# Patient Record
Sex: Female | Born: 1955 | Race: White | Hispanic: No | Marital: Married | Smoking: Never smoker
Health system: Southern US, Community
[De-identification: ages and names within clinical notes are randomized; demographics above are authoritative.]

## PROBLEM LIST (undated history)

## (undated) ENCOUNTER — Emergency Department (HOSPITAL_COMMUNITY): Admission: EM | Payer: BC Managed Care – PPO | Source: Home / Self Care

## (undated) DIAGNOSIS — F329 Major depressive disorder, single episode, unspecified: Secondary | ICD-10-CM

## (undated) DIAGNOSIS — K648 Other hemorrhoids: Secondary | ICD-10-CM

## (undated) DIAGNOSIS — G709 Myoneural disorder, unspecified: Secondary | ICD-10-CM

## (undated) DIAGNOSIS — I8390 Asymptomatic varicose veins of unspecified lower extremity: Secondary | ICD-10-CM

## (undated) DIAGNOSIS — E785 Hyperlipidemia, unspecified: Secondary | ICD-10-CM

## (undated) DIAGNOSIS — Z8742 Personal history of other diseases of the female genital tract: Secondary | ICD-10-CM

## (undated) DIAGNOSIS — N390 Urinary tract infection, site not specified: Secondary | ICD-10-CM

## (undated) DIAGNOSIS — R1032 Left lower quadrant pain: Secondary | ICD-10-CM

## (undated) DIAGNOSIS — M8580 Other specified disorders of bone density and structure, unspecified site: Secondary | ICD-10-CM

## (undated) DIAGNOSIS — K219 Gastro-esophageal reflux disease without esophagitis: Secondary | ICD-10-CM

## (undated) DIAGNOSIS — F419 Anxiety disorder, unspecified: Secondary | ICD-10-CM

## (undated) DIAGNOSIS — F32A Depression, unspecified: Secondary | ICD-10-CM

## (undated) DIAGNOSIS — M25561 Pain in right knee: Secondary | ICD-10-CM

## (undated) DIAGNOSIS — R112 Nausea with vomiting, unspecified: Secondary | ICD-10-CM

## (undated) DIAGNOSIS — J309 Allergic rhinitis, unspecified: Secondary | ICD-10-CM

## (undated) DIAGNOSIS — R21 Rash and other nonspecific skin eruption: Secondary | ICD-10-CM

## (undated) DIAGNOSIS — G629 Polyneuropathy, unspecified: Secondary | ICD-10-CM

## (undated) DIAGNOSIS — E538 Deficiency of other specified B group vitamins: Secondary | ICD-10-CM

## (undated) DIAGNOSIS — E7800 Pure hypercholesterolemia, unspecified: Secondary | ICD-10-CM

## (undated) DIAGNOSIS — T7840XA Allergy, unspecified, initial encounter: Secondary | ICD-10-CM

## (undated) DIAGNOSIS — D126 Benign neoplasm of colon, unspecified: Secondary | ICD-10-CM

## (undated) DIAGNOSIS — K644 Residual hemorrhoidal skin tags: Secondary | ICD-10-CM

## (undated) DIAGNOSIS — E559 Vitamin D deficiency, unspecified: Secondary | ICD-10-CM

## (undated) DIAGNOSIS — Z87442 Personal history of urinary calculi: Secondary | ICD-10-CM

## (undated) DIAGNOSIS — G8929 Other chronic pain: Secondary | ICD-10-CM

## (undated) DIAGNOSIS — K921 Melena: Secondary | ICD-10-CM

## (undated) DIAGNOSIS — G4700 Insomnia, unspecified: Secondary | ICD-10-CM

## (undated) DIAGNOSIS — Z9889 Other specified postprocedural states: Secondary | ICD-10-CM

## (undated) DIAGNOSIS — M25562 Pain in left knee: Secondary | ICD-10-CM

## (undated) DIAGNOSIS — I10 Essential (primary) hypertension: Secondary | ICD-10-CM

## (undated) DIAGNOSIS — Z87898 Personal history of other specified conditions: Secondary | ICD-10-CM

## (undated) DIAGNOSIS — E139 Other specified diabetes mellitus without complications: Secondary | ICD-10-CM

## (undated) HISTORY — DX: Personal history of other specified conditions: Z87.898

## (undated) HISTORY — DX: Rash and other nonspecific skin eruption: R21

## (undated) HISTORY — DX: Myoneural disorder, unspecified: G70.9

## (undated) HISTORY — DX: Other specified diabetes mellitus without complications: E13.9

## (undated) HISTORY — DX: Personal history of other diseases of the female genital tract: Z87.42

## (undated) HISTORY — DX: Insomnia, unspecified: G47.00

## (undated) HISTORY — DX: Allergy, unspecified, initial encounter: T78.40XA

## (undated) HISTORY — DX: Polyneuropathy, unspecified: G62.9

## (undated) HISTORY — DX: Asymptomatic varicose veins of unspecified lower extremity: I83.90

## (undated) HISTORY — DX: Other chronic pain: G89.29

## (undated) HISTORY — DX: Benign neoplasm of colon, unspecified: D12.6

## (undated) HISTORY — PX: UPPER GASTROINTESTINAL ENDOSCOPY: SHX188

## (undated) HISTORY — DX: Deficiency of other specified B group vitamins: E53.8

## (undated) HISTORY — DX: Hyperlipidemia, unspecified: E78.5

## (undated) HISTORY — DX: Other specified disorders of bone density and structure, unspecified site: M85.80

## (undated) HISTORY — PX: APPENDECTOMY: SHX54

## (undated) HISTORY — DX: Allergic rhinitis, unspecified: J30.9

## (undated) HISTORY — PX: ENDOMETRIAL CRYOABLATION: SHX1499

## (undated) HISTORY — DX: Vitamin D deficiency, unspecified: E55.9

## (undated) HISTORY — DX: Other hemorrhoids: K64.8

## (undated) HISTORY — PX: TONSILLECTOMY AND ADENOIDECTOMY: SUR1326

## (undated) HISTORY — DX: Depression, unspecified: F32.A

## (undated) HISTORY — DX: Pain in left knee: M25.562

## (undated) HISTORY — DX: Major depressive disorder, single episode, unspecified: F32.9

## (undated) HISTORY — DX: Anxiety disorder, unspecified: F41.9

## (undated) HISTORY — DX: Pure hypercholesterolemia, unspecified: E78.00

## (undated) HISTORY — PX: POLYPECTOMY: SHX149

## (undated) HISTORY — DX: Essential (primary) hypertension: I10

## (undated) HISTORY — DX: Urinary tract infection, site not specified: N39.0

## (undated) HISTORY — DX: Melena: K92.1

## (undated) HISTORY — PX: UPPER GI ENDOSCOPY: SHX6162

## (undated) HISTORY — DX: Pain in right knee: M25.561

## (undated) HISTORY — DX: Residual hemorrhoidal skin tags: K64.4

## (undated) HISTORY — DX: Left lower quadrant pain: R10.32

---

## 1998-02-16 ENCOUNTER — Ambulatory Visit (HOSPITAL_COMMUNITY): Admission: RE | Admit: 1998-02-16 | Discharge: 1998-02-16 | Payer: Self-pay | Admitting: Obstetrics and Gynecology

## 1998-02-19 ENCOUNTER — Ambulatory Visit (HOSPITAL_COMMUNITY): Admission: RE | Admit: 1998-02-19 | Discharge: 1998-02-19 | Payer: Self-pay | Admitting: Obstetrics and Gynecology

## 1999-04-05 ENCOUNTER — Encounter: Payer: Self-pay | Admitting: Obstetrics and Gynecology

## 1999-04-05 ENCOUNTER — Ambulatory Visit (HOSPITAL_COMMUNITY): Admission: RE | Admit: 1999-04-05 | Discharge: 1999-04-05 | Payer: Self-pay | Admitting: Obstetrics and Gynecology

## 1999-11-30 ENCOUNTER — Other Ambulatory Visit: Admission: RE | Admit: 1999-11-30 | Discharge: 1999-11-30 | Payer: Self-pay | Admitting: Obstetrics and Gynecology

## 2000-06-13 ENCOUNTER — Ambulatory Visit (HOSPITAL_COMMUNITY): Admission: RE | Admit: 2000-06-13 | Discharge: 2000-06-13 | Payer: Self-pay | Admitting: Obstetrics and Gynecology

## 2000-06-13 ENCOUNTER — Encounter: Payer: Self-pay | Admitting: Obstetrics and Gynecology

## 2001-01-02 ENCOUNTER — Other Ambulatory Visit: Admission: RE | Admit: 2001-01-02 | Discharge: 2001-01-02 | Payer: Self-pay | Admitting: Obstetrics and Gynecology

## 2001-06-18 ENCOUNTER — Ambulatory Visit (HOSPITAL_COMMUNITY): Admission: RE | Admit: 2001-06-18 | Discharge: 2001-06-18 | Payer: Self-pay | Admitting: Obstetrics and Gynecology

## 2001-06-18 ENCOUNTER — Encounter: Payer: Self-pay | Admitting: Obstetrics and Gynecology

## 2002-01-15 ENCOUNTER — Other Ambulatory Visit: Admission: RE | Admit: 2002-01-15 | Discharge: 2002-01-15 | Payer: Self-pay | Admitting: Obstetrics and Gynecology

## 2002-06-27 ENCOUNTER — Ambulatory Visit (HOSPITAL_COMMUNITY): Admission: RE | Admit: 2002-06-27 | Discharge: 2002-06-27 | Payer: Self-pay | Admitting: Obstetrics and Gynecology

## 2002-06-27 ENCOUNTER — Encounter: Payer: Self-pay | Admitting: Obstetrics and Gynecology

## 2003-08-19 ENCOUNTER — Ambulatory Visit (HOSPITAL_COMMUNITY): Admission: RE | Admit: 2003-08-19 | Discharge: 2003-08-19 | Payer: Self-pay | Admitting: Obstetrics and Gynecology

## 2004-05-26 ENCOUNTER — Ambulatory Visit (HOSPITAL_COMMUNITY): Admission: RE | Admit: 2004-05-26 | Discharge: 2004-05-26 | Payer: Self-pay | Admitting: Specialist

## 2004-07-20 ENCOUNTER — Ambulatory Visit: Payer: Self-pay | Admitting: Gastroenterology

## 2004-08-05 ENCOUNTER — Ambulatory Visit: Payer: Self-pay | Admitting: Gastroenterology

## 2004-09-17 ENCOUNTER — Ambulatory Visit (HOSPITAL_COMMUNITY): Admission: RE | Admit: 2004-09-17 | Discharge: 2004-09-17 | Payer: Self-pay | Admitting: Obstetrics and Gynecology

## 2005-11-10 ENCOUNTER — Ambulatory Visit (HOSPITAL_COMMUNITY): Admission: RE | Admit: 2005-11-10 | Discharge: 2005-11-10 | Payer: Self-pay | Admitting: Obstetrics and Gynecology

## 2006-01-31 ENCOUNTER — Emergency Department (HOSPITAL_COMMUNITY): Admission: EM | Admit: 2006-01-31 | Discharge: 2006-01-31 | Payer: Self-pay | Admitting: Emergency Medicine

## 2006-12-01 ENCOUNTER — Ambulatory Visit (HOSPITAL_COMMUNITY): Admission: RE | Admit: 2006-12-01 | Discharge: 2006-12-01 | Payer: Self-pay | Admitting: Obstetrics and Gynecology

## 2007-06-13 ENCOUNTER — Ambulatory Visit: Payer: Self-pay | Admitting: Gastroenterology

## 2007-11-02 ENCOUNTER — Ambulatory Visit: Payer: Self-pay | Admitting: Gastroenterology

## 2007-11-13 DIAGNOSIS — I10 Essential (primary) hypertension: Secondary | ICD-10-CM

## 2007-11-13 DIAGNOSIS — K644 Residual hemorrhoidal skin tags: Secondary | ICD-10-CM

## 2007-11-13 DIAGNOSIS — K921 Melena: Secondary | ICD-10-CM | POA: Insufficient documentation

## 2007-11-13 DIAGNOSIS — R1032 Left lower quadrant pain: Secondary | ICD-10-CM | POA: Insufficient documentation

## 2007-11-13 DIAGNOSIS — Z8742 Personal history of other diseases of the female genital tract: Secondary | ICD-10-CM

## 2007-11-13 DIAGNOSIS — K648 Other hemorrhoids: Secondary | ICD-10-CM

## 2007-11-13 HISTORY — DX: Residual hemorrhoidal skin tags: K64.4

## 2007-11-13 HISTORY — DX: Personal history of other diseases of the female genital tract: Z87.42

## 2007-11-13 HISTORY — DX: Melena: K92.1

## 2007-11-13 HISTORY — DX: Essential (primary) hypertension: I10

## 2007-11-13 HISTORY — DX: Other hemorrhoids: K64.8

## 2007-11-13 HISTORY — DX: Left lower quadrant pain: R10.32

## 2008-02-22 ENCOUNTER — Ambulatory Visit (HOSPITAL_COMMUNITY): Admission: RE | Admit: 2008-02-22 | Discharge: 2008-02-22 | Payer: Self-pay | Admitting: Obstetrics and Gynecology

## 2009-02-23 ENCOUNTER — Ambulatory Visit (HOSPITAL_COMMUNITY): Admission: RE | Admit: 2009-02-23 | Discharge: 2009-02-23 | Payer: Self-pay | Admitting: Obstetrics and Gynecology

## 2009-07-09 ENCOUNTER — Encounter (INDEPENDENT_AMBULATORY_CARE_PROVIDER_SITE_OTHER): Payer: Self-pay | Admitting: *Deleted

## 2009-09-05 HISTORY — PX: COLONOSCOPY: SHX174

## 2010-02-16 ENCOUNTER — Encounter (INDEPENDENT_AMBULATORY_CARE_PROVIDER_SITE_OTHER): Payer: Self-pay | Admitting: *Deleted

## 2010-02-19 ENCOUNTER — Ambulatory Visit: Payer: Self-pay | Admitting: Gastroenterology

## 2010-02-26 ENCOUNTER — Ambulatory Visit (HOSPITAL_COMMUNITY): Admission: RE | Admit: 2010-02-26 | Discharge: 2010-02-26 | Payer: Self-pay | Admitting: Obstetrics and Gynecology

## 2010-03-01 ENCOUNTER — Ambulatory Visit: Payer: Self-pay | Admitting: Gastroenterology

## 2010-03-02 ENCOUNTER — Encounter: Payer: Self-pay | Admitting: Gastroenterology

## 2010-07-28 ENCOUNTER — Observation Stay (HOSPITAL_COMMUNITY): Admission: EM | Admit: 2010-07-28 | Discharge: 2010-07-29 | Payer: Self-pay | Admitting: Emergency Medicine

## 2010-10-07 NOTE — Letter (Signed)
Summary: Patient Notice-Hyperplastic Polyps  [facility]  [address] [place], [place] [address]   Phone: [phone]  Fax: [phone]        March 02, 2010 MRN: [id number]    Amanda Anthony [address] [place], [place]  [address]    Dear [name],  I am pleased to inform you that the colon polyp(s) removed during your recent colonoscopy was (were) found to be hyperplastic. These types of polyps are NOT pre-cancerous.  It is my recommendation that you have a repeat colonoscopy examination in 5 years for routine colorectal cancer screening.  Should you develop new or worsening symptoms of abdominal pain, bowel habit changes or bleeding from the rectum or bowels, please schedule an evaluation with either your primary care physician or with me.  Continue treatment plan as outlined the day of your exam.  Please call us if you are having persistent problems or have questions about your condition that have not been fully answered at this time.  Sincerely,  Meryl Dare MD [facility]  This letter has been electronically signed by your physician.  Appended Document: Patient Notice-Hyperplastic Polyps letter mailed 7.6.11

## 2010-10-07 NOTE — Miscellaneous (Signed)
Summary: [facility] Previsit/prep  Clinical Lists Changes  Medications: Added new medication of MOVIPREP 100 GM  SOLR (PEG-KCL-NACL-NASULF-NA ASC-C) As per prep instructions. - Signed Rx of MOVIPREP 100 GM  SOLR (PEG-KCL-NACL-NASULF-NA ASC-C) As per prep instructions.;  #1 x 0;  Signed;  Entered by: Wyona Almas RN;  Authorized by: Meryl Dare MD Clementeen Graham;  Method used: Electronically to [facility] [address], [place], [place], [place]  [address], Ph: [phone] or [phone], Fax: [phone] Observations: Added new observation of NKA: T (02/19/2010 10:58)    Prescriptions: MOVIPREP 100 GM  SOLR (PEG-KCL-NACL-NASULF-NA ASC-C) As per prep instructions.  #1 x 0   Entered by:   Wyona Almas RN   Authorized by:   Meryl Dare MD [facility]   Signed by:   Wyona Almas RN on 02/19/2010   Method used:   Electronically to        [facility] (retail)       [address]       [place], [place]  [address]       Ph: [phone] or [phone]       Fax: [phone]   RxID:   [id number]

## 2010-10-07 NOTE — Letter (Signed)
Summary: [facility] Instructions  [facility]  [address] [place], [place] [address]   Phone: [phone]  Fax: [phone]       Amanda Anthony    1955-10-13    MRN: [id number]        Procedure Day Dorna Bloom:  Duanne Limerick  03/01/10     Arrival Time: 10:30am     Procedure Time:  11:30am     Location of Procedure:                    _X _  [facility] (4th Floor)                       PREPARATION FOR COLONOSCOPY WITH MOVIPREP   Starting 5 days prior to your procedure  [facility] 02/24/10  do not eat nuts, seeds, popcorn, corn, beans, peas,  salads, or any raw vegetables.  Do not take any fiber supplements (e.g. Metamucil, Citrucel, and Benefiber).  THE DAY BEFORE YOUR PROCEDURE         DATE: SUNDAY  02/28/10  1.  Drink clear liquids the entire day-NO SOLID FOOD  2.  Do not drink anything colored red or purple.  Avoid juices with pulp.  No orange juice.  3.  Drink at least 64 oz. (8 glasses) of fluid/clear liquids during the day to prevent dehydration and help the prep work efficiently.  CLEAR LIQUIDS INCLUDE: Water Jello Ice Popsicles Tea (sugar ok, no milk/cream) Powdered fruit flavored drinks Coffee (sugar ok, no milk/cream) Gatorade Juice: apple, white grape, white cranberry  Lemonade Clear bullion, consomm, broth Carbonated beverages (any kind) Strained chicken noodle soup Hard Candy                             4.  In the morning, mix first dose of MoviPrep solution:    Empty 1 Pouch A and 1 Pouch B into the disposable container    Add lukewarm drinking water to the top line of the container. Mix to dissolve    Refrigerate (mixed solution should be used within 24 hrs)  5.  Begin drinking the prep at 5:00 p.m. The MoviPrep container is divided by 4 marks.   Every 15 minutes drink the solution down to the next mark (approximately 8 oz) until the full liter is complete.   6.  Follow completed prep with 16 oz of clear liquid of your choice  (Nothing red or purple).  Continue to drink clear liquids until bedtime.  7.  Before going to bed, mix second dose of MoviPrep solution:    Empty 1 Pouch A and 1 Pouch B into the disposable container    Add lukewarm drinking water to the top line of the container. Mix to dissolve    Refrigerate  THE DAY OF YOUR PROCEDURE      DATE:  [facility]  03/01/10  Beginning at  6:30 a.m. (5 hours before procedure):         1. Every 15 minutes, drink the solution down to the next mark (approx 8 oz) until the full liter is complete.  2. Follow completed prep with 16 oz. of clear liquid of your choice.    3. You may drink clear liquids until  9:30am  (2 HOURS BEFORE PROCEDURE).   MEDICATION INSTRUCTIONS  Unless otherwise instructed, you should take regular prescription medications with a small sip of water   as early  as possible the morning of your procedure.        OTHER INSTRUCTIONS  You will need a responsible adult at least 55 years of age to accompany you and drive you home.   This person must remain in the waiting room during your procedure.  Wear loose fitting clothing that is easily removed.  Leave jewelry and other valuables at home.  However, you may wish to bring a book to read or  an iPod/MP3 player to listen to music as you wait for your procedure to start.  Remove all body piercing jewelry and leave at home.  Total time from sign-in until discharge is approximately 2-3 hours.  You should go home directly after your procedure and rest.  You can resume normal activities the  day after your procedure.  The day of your procedure you should not:   Drive   Make legal decisions   Operate machinery   Drink alcohol   Return to work  You will receive specific instructions about eating, activities and medications before you leave.    The above instructions have been reviewed and explained to me by   Wyona Almas RN  February 19, 2010 11:28 AM     I fully understand  and can verbalize these instructions _____________________________ Date _________

## 2010-10-07 NOTE — Procedures (Signed)
Summary: Colonoscopy  Patient: Amanda Anthony Note: All result statuses are Final unless otherwise noted.  Tests: (1) Colonoscopy (COL)   COL Colonoscopy           DONE     Amanda Anthony     [address]     [place], [place]  [address]           COLONOSCOPY PROCEDURE REPORT           PATIENT:  Amanda, Anthony  MR#:  [id number]     BIRTHDATE:  08-12-1956, 53 yrs. old  GENDER:  female     ENDOSCOPIST:  Judie Petit T. Russella Dar, MD, [facility]           PROCEDURE DATE:  03/01/2010     PROCEDURE:  Colonoscopy with biopsy     ASA CLASS:  Class I     INDICATIONS:  1) Elevated Risk Screening:  family history of colon     cancer-MGM and M aunt, family Hx of polyps-both parents     MEDICATIONS:   Fentanyl 75 mcg IV, Versed 7 mg IV     DESCRIPTION OF PROCEDURE:   After the risks benefits and     alternatives of the procedure were thoroughly explained, informed     consent was obtained.  Digital rectal exam was performed and     revealed no abnormalities.   The LB PCF-H180AL [id number] endoscope     was introduced through the anus and advanced to the cecum, which     was identified by both the appendix and ileocecal valve, without     limitations.  The quality of the prep was excellent, using     MoviPrep.  The instrument was then slowly withdrawn as the colon     was fully examined.     <<PROCEDUREIMAGES>>     FINDINGS:  Two polyps were found in the proximal transverse colon.     They were 3 - 4 mm in size. The polyps were removed using cold     biopsy forceps.  A sessile polyp was found in the sigmoid colon.     It was 4 mm in size. The polyp was removed using cold biopsy     forceps.  A normal appearing cecum, ileocecal valve, and     appendiceal orifice were identified. The ascending, hepatic     flexure, splenic flexure, descending colon, and rectum appeared     unremarkable. Retroflexed views in the rectum revealed no     abnormalities. The time to cecum =  1.75  minutes. The scope was     then  withdrawn (time =  8.75  min) from the patient and the     procedure completed.           COMPLICATIONS:  None           ENDOSCOPIC IMPRESSION:     1) 3 - 4 mm, two polyps in the proximal transverse colon     2) 4 mm sessile polyp in the sigmoid colon           RECOMMENDATIONS:     1) Await pathology results     2) Repeat Colonoscopy in 5 years pending pathology review           Amanda Anthony T. Russella Dar, MD, Amanda Anthony           CC: Amanda Felix MD           n.     Amanda Anthony:  Amanda Anthony at 03/01/2010 11:57 AM           Amanda Anthony, [id number]  Note: An exclamation mark (!) indicates a result that was not dispersed into the flowsheet. Document Creation Date: 03/01/2010 11:58 AM _______________________________________________________________________  (1) Order result status: Final Collection or observation date-time: 03/01/2010 11:49 Requested date-time:  Receipt date-time:  Reported date-time:  Referring Physician:   Ordering Physician: Amanda Anthony [phone]) Specimen Source:  Source: Amanda Anthony Order Number: [id number] Lab site:   Appended Document: Colonoscopy     Procedures Next Due Date:    Colonoscopy: 02/2015

## 2010-11-16 LAB — PROTIME-INR: INR: 1.33 (ref 0.00–1.49)

## 2010-11-16 LAB — CBC
HCT: 38.5 % (ref 36.0–46.0)
Hemoglobin: 12.5 g/dL (ref 12.0–15.0)
MCH: 26.9 pg (ref 26.0–34.0)
MCH: 27.3 pg (ref 26.0–34.0)
MCH: 27.6 pg (ref 26.0–34.0)
MCHC: 32.2 g/dL (ref 30.0–36.0)
MCHC: 32.6 g/dL (ref 30.0–36.0)
Platelets: 250 10*3/uL (ref 150–400)
RBC: 4.53 MIL/uL (ref 3.87–5.11)
RBC: 4.55 MIL/uL (ref 3.87–5.11)
RDW: 14.5 % (ref 11.5–15.5)
RDW: 14.5 % (ref 11.5–15.5)
WBC: 7.9 10*3/uL (ref 4.0–10.5)
WBC: 9.2 10*3/uL (ref 4.0–10.5)

## 2010-11-16 LAB — POCT CARDIAC MARKERS
CKMB, poc: <1 ng/mL — ABNORMAL LOW (ref 1.0–8.0)
Troponin i, poc: <0.05 ng/mL (ref 0.00–0.09)

## 2010-11-16 LAB — COMPREHENSIVE METABOLIC PANEL
AST: 19 U/L (ref 0–37)
Alkaline Phosphatase: 62 U/L (ref 39–117)
BUN: 13 mg/dL (ref 6–23)
Calcium: 8.9 mg/dL (ref 8.4–10.5)
Creatinine, Ser: 0.78 mg/dL (ref 0.4–1.2)
GFR calc non Af Amer: >60 mL/min (ref >60)
Potassium: 3.9 mEq/L (ref 3.5–5.1)
Sodium: 137 mEq/L (ref 135–145)
Total Bilirubin: 0.4 mg/dL (ref 0.3–1.2)

## 2010-11-16 LAB — DIFFERENTIAL
Basophils Relative: 0 % (ref 0–1)
Eosinophils Absolute: 0.1 10*3/uL (ref 0.0–0.7)
Eosinophils Absolute: 0.1 10*3/uL (ref 0.0–0.7)
Eosinophils Relative: 1 % (ref 0–5)
Lymphocytes Relative: 29 % (ref 12–46)
Monocytes Absolute: 0.6 10*3/uL (ref 0.1–1.0)
Monocytes Absolute: 0.6 10*3/uL (ref 0.1–1.0)
Monocytes Relative: 6 % (ref 3–12)
Monocytes Relative: 7 % (ref 3–12)
Neutro Abs: 5.4 10*3/uL (ref 1.7–7.7)
Neutrophils Relative %: 63 % (ref 43–77)

## 2010-11-16 LAB — URINALYSIS, ROUTINE W REFLEX MICROSCOPIC
Bilirubin Urine: NEGATIVE
Glucose, UA: NEGATIVE mg/dL
Hgb urine dipstick: NEGATIVE
Urobilinogen, UA: 0.2 mg/dL (ref 0.0–1.0)
pH: 5 (ref 5.0–8.0)

## 2010-11-16 LAB — TSH: TSH: 0.531 u[IU]/mL (ref 0.350–4.500)

## 2010-11-16 LAB — CARDIAC PANEL(CRET KIN+CKTOT+MB+TROPI)
CK, MB: 1 ng/mL (ref 0.3–4.0)
CK, MB: 1.2 ng/mL (ref 0.3–4.0)
Total CK: 61 U/L (ref 7–177)
Troponin I: 0.01 ng/mL (ref 0.00–0.06)

## 2010-11-16 LAB — GLUCOSE, CAPILLARY: Glucose-Capillary: 108 mg/dL — ABNORMAL HIGH (ref 70–99)

## 2010-11-16 LAB — BASIC METABOLIC PANEL
Calcium: 9.2 mg/dL (ref 8.4–10.5)
GFR calc non Af Amer: >60 mL/min (ref >60)
Potassium: 4 mEq/L (ref 3.5–5.1)
Sodium: 137 mEq/L (ref 135–145)

## 2010-11-16 LAB — LIPID PANEL: Cholesterol: 200 mg/dL (ref 0–200)

## 2011-01-18 NOTE — Assessment & Plan Note (Signed)
[facility]                         GASTROENTEROLOGY OFFICE NOTE   NAME:Amanda Anthony, Amanda Anthony                        MRN:          [id number]  DATE:11/02/2007                            DOB:          22-Feb-1956    Amanda Anthony returns complaining of a small bump on the outside of her  rectum.  She has a known history of hemorrhoids.  She has no other  colorectal complaints and notes no rectal bleeding.  Change in stool  caliber, change in bowel habits, rectal pain, rectal itching or  abdominal pain.  Her maternal grandmother and a maternal aunt both had  colon cancer.  The patient's prior colonoscopy August 05, 2004 showed  internal hemorrhoids and no other abnormalities.   CURRENT MEDICATIONS:  Calcium daily  Ambien p.r.n.   MEDICATION ALLERGIES:  NONE KNOWN.   PHYSICAL EXAMINATION:  In no acute distress.  Weight 204.4.  Blood  pressure 128/72.  Pulse 82 and regular.  CHEST:  Clear to auscultation bilaterally.  CARDIAC:  Regular rate and rhythm without murmurs.  ABDOMEN:  Soft, nontender with normoactive bowel sounds.  No palpable  organomegaly, masses, or hernias.  RECTAL:  Reveals external hemorrhoids and hemorrhoidal tags.  No other  lesions noted.  Digital examination reveals no lesions, and/or  tenderness.  There is hemoccult negative, yellow-brown mucus in the  rectal vault.   ASSESSMENT AND PLAN:  1. External hemorrhoids and ext. hemorrhoid tags.  She is reassured.      She may try an over-the-counter hydrocortisone hemorrhoidal cream      as needed.  She is given standard instructions on hemorrhoidal      care.  2. Family history of colon cancer.  Maternal grandmother and a      maternal aunt.  Obtain home stool hemoccults.  Recall colonoscopy      recommended for December 2010.     Venita Lick. Russella Dar, MD, [facility]  Electronically Signed    MTS/MedQ  DD: 11/02/2007  DT: 11/03/2007  Job #: [id number]

## 2011-01-18 NOTE — Assessment & Plan Note (Signed)
[facility]                         GASTROENTEROLOGY OFFICE NOTE   NAME:Scullin, Manfred Arch                        MRN:          [id number]  DATE:06/13/2007                            DOB:          14-Jun-1956    HISTORY OF PRESENT ILLNESS:  I previously evaluated Ms. Wettstein in  November 2005.  She had hematochezia and a change in bowel habits with  looser, frequent stools and mild lower abdominal cramping.  In addition,  she has a family history of colon cancer in her maternal grandmother and  maternal aunt, both at about age 78.  In addition, her father has a  history of colon polyps.  She previously underwent colonoscopy in  December 2005, which showed only internal hemorrhoids.  For the past  three years, she has had intermittently left lower quadrant pain and  over the past three weeks these symptoms have worsened and have been  associated with occasional left upper quadrant pain.  She was evaluated  by Dr. Rosalio Macadamia in June 2008, without obvious gynecologic problems  noted.  She still notes occasional small volume hematochezia.  Her  appetite is good and her weight is stable.  Her left sided abdominal  pains did not appear to be related to any digestive function and  occasionally worsened with movement.  At other times, they are  unpredictable.   CURRENT MEDICATIONS:  1. Calcium daily.  2. Ambien p.r.n.   ALLERGIES:  None known.   PHYSICAL EXAMINATION:  GENERAL:  No acute distress.  VITAL SIGNS:  Weight 206.6 pounds, blood pressure 130/80, pulse 78 and  regular.  CHEST:  Clear to auscultation bilaterally.  CARDIAC:  Regular rate and rhythm without murmurs.  ABDOMEN:  Soft with mild left lower quadrant and left upper quadrant  tenderness to deep palpation.  No rebound or guarding.  No palpable  organomegaly, masses or hernias.  Normoactive bowel sounds.  NEUROLOGICAL:  Alert and oriented x3.  Grossly nonfocal.   ASSESSMENT/PLAN:  Episodic left  lower quadrant pain for the past three  years.  Recent mild left upper quadrant pain.  She tends to have loose  stools and has noted occasional hematochezia, but her abdominal pain is  not clearly associated with any digestive function.  It is difficult to  determine whether her pain is musculoskeletal, gastrointestinal or  gynecologic.  Will obtain stool hemoccults and begin a course of Robinul  Forte 1/2 to 1 tablet b.i.d. for possible irritable bowel syndrome.  If  her symptoms do not improve over the course of 2-3 weeks, she then can  try Advil 600 mg t.i.d. and assess the response to Advil.  Return office  visit in 4-6  weeks with me.  If her symptoms have not substantially improved, will  plan to consider a CT scan of the abdomen and pelvis and colonoscopy.     Venita Lick. Russella Dar, MD, [facility]  Electronically Signed    MTS/MedQ  DD: 06/25/2007  DT: 06/26/2007  Job #: [id number]   cc:   Sherry A. Rosalio Macadamia, M.D.

## 2011-01-26 ENCOUNTER — Other Ambulatory Visit (HOSPITAL_COMMUNITY): Payer: Self-pay | Admitting: Obstetrics and Gynecology

## 2011-01-26 DIAGNOSIS — Z1231 Encounter for screening mammogram for malignant neoplasm of breast: Secondary | ICD-10-CM

## 2011-03-01 ENCOUNTER — Ambulatory Visit (HOSPITAL_COMMUNITY): Payer: Managed Care, Other (non HMO)

## 2011-03-17 ENCOUNTER — Ambulatory Visit (HOSPITAL_COMMUNITY): Payer: Managed Care, Other (non HMO)

## 2011-03-29 ENCOUNTER — Ambulatory Visit (HOSPITAL_COMMUNITY)
Admission: RE | Admit: 2011-03-29 | Discharge: 2011-03-29 | Disposition: A | Discharge status: Home / Self Care | Payer: Managed Care, Other (non HMO) | Source: Ambulatory Visit | Attending: Obstetrics and Gynecology | Admitting: Obstetrics and Gynecology

## 2011-03-29 DIAGNOSIS — Z1231 Encounter for screening mammogram for malignant neoplasm of breast: Secondary | ICD-10-CM | POA: Insufficient documentation

## 2011-12-17 ENCOUNTER — Encounter (HOSPITAL_COMMUNITY): Payer: Self-pay

## 2011-12-17 ENCOUNTER — Emergency Department (HOSPITAL_COMMUNITY)
Admission: EM | Admit: 2011-12-17 | Discharge: 2011-12-17 | Disposition: A | Discharge status: Home / Self Care | Payer: Managed Care, Other (non HMO) | Attending: Emergency Medicine | Admitting: Emergency Medicine

## 2011-12-17 ENCOUNTER — Emergency Department (HOSPITAL_COMMUNITY): Payer: Managed Care, Other (non HMO)

## 2011-12-17 DIAGNOSIS — R209 Unspecified disturbances of skin sensation: Secondary | ICD-10-CM | POA: Insufficient documentation

## 2011-12-17 DIAGNOSIS — R200 Anesthesia of skin: Secondary | ICD-10-CM

## 2011-12-17 DIAGNOSIS — E119 Type 2 diabetes mellitus without complications: Secondary | ICD-10-CM | POA: Insufficient documentation

## 2011-12-17 DIAGNOSIS — R29810 Facial weakness: Secondary | ICD-10-CM | POA: Insufficient documentation

## 2011-12-17 DIAGNOSIS — G510 Bell's palsy: Secondary | ICD-10-CM

## 2011-12-17 DIAGNOSIS — Z79899 Other long term (current) drug therapy: Secondary | ICD-10-CM | POA: Insufficient documentation

## 2011-12-17 HISTORY — DX: Gastro-esophageal reflux disease without esophagitis: K21.9

## 2011-12-17 MED ORDER — PREDNISONE 20 MG PO TABS: 60.0000 mg | ORAL_TABLET | Freq: Every day | ORAL | Status: AC

## 2011-12-17 MED ORDER — ARTIFICIAL TEARS OP OINT: TOPICAL_OINTMENT | OPHTHALMIC | Status: DC | PRN

## 2011-12-17 NOTE — ED Notes (Signed)
Last night pt. Was having pain in both of her eyes and dryness.  Shw woke up this am lt . Eye not being able to close the lid completely and her rt. Eye being swollen,  She also states that her lips are numb and she was unable to whistle,.  She is also having  Pain in her lt. Ear with pain under the ear.   No weakness in her extremities, denies any dizziness, speech is clear.  Denies any headaches or blurred vision

## 2011-12-17 NOTE — ED Provider Notes (Signed)
History     CSN: [id number]  Arrival date & time 12/17/11  1338   First MD Initiated Contact with Patient 12/17/11 1449      Chief Complaint  Patient presents with  . Numbness    (Consider location/radiation/quality/duration/timing/severity/associated sxs/prior treatment) The history is provided by the patient.   patient states that she woke up this morning with weakness in her left face and numbness in right face. She states that last night she had some pain in her right eye. She states she's been unable with. She has some pain under her left ear. No headaches or blurred vision. She's not had this before. No recent viral illness.  Past Medical History  Diagnosis Date  . Diabetes mellitus   . GERD (gastroesophageal reflux disease)     Past Surgical History  Procedure Date  . Tonsillectomy   . Appendectomy     History reviewed. No pertinent family history.  History  Substance Use Topics  . Smoking status: Never Smoker   . Smokeless tobacco: Not on file  . Alcohol Use: No    OB History    Grav Para Term Preterm Abortions TAB SAB Ect Mult Living                  Review of Systems  Constitutional: Negative for activity change and appetite change.  HENT: Negative for neck stiffness.   Eyes: Negative for pain.  Respiratory: Negative for chest tightness and shortness of breath.   Cardiovascular: Negative for chest pain and leg swelling.  Gastrointestinal: Negative for nausea, vomiting, abdominal pain and diarrhea.  Genitourinary: Negative for flank pain.  Musculoskeletal: Negative for back pain.  Skin: Negative for rash.  Neurological: Positive for weakness and numbness. Negative for headaches.  Psychiatric/Behavioral: Negative for behavioral problems.    Allergies  Review of patient's allergies indicates no known allergies.  Home Medications   Current Outpatient Rx  Name Route Sig Dispense Refill  . ESCITALOPRAM OXALATE 20 MG PO TABS Oral Take 20 mg by  mouth daily.    Marland Kitchen ESOMEPRAZOLE MAGNESIUM 40 MG PO CPDR Oral Take 40 mg by mouth daily before breakfast.    . ESZOPICLONE 2 MG PO TABS Oral Take 3 mg by mouth at bedtime. Take immediately before bedtime    . MELATONIN 1 MG PO TABS Oral Take 1 tablet by mouth at bedtime.    Marland Kitchen METFORMIN HCL 500 MG PO TABS Oral Take 500 mg by mouth every evening.    Marland Kitchen PANTOPRAZOLE SODIUM 40 MG PO TBEC Oral Take 40 mg by mouth daily.    Marland Kitchen SIMVASTATIN 20 MG PO TABS Oral Take 20 mg by mouth every evening.    Marland Kitchen ARTIFICIAL TEARS OP OINT Ophthalmic Apply to eye every 4 (four) hours as needed. 1 Tube 0  . PREDNISONE 20 MG PO TABS Oral Take 3 tablets (60 mg total) by mouth daily. 15 tablet 0    BP 141/78  Pulse 61  Temp(Src) 98.1 F (36.7 C) (Oral)  Resp 20  SpO2 98%  Physical Exam  Nursing note and vitals reviewed. Constitutional: She is oriented to person, place, and time. She appears well-developed and well-nourished.  HENT:  Head: Normocephalic and atraumatic.  Left Ear: External ear normal.  Eyes: EOM are normal. Pupils are equal, round, and reactive to light.  Neck: Normal range of motion. Neck supple.  Cardiovascular: Normal rate, regular rhythm and normal heart sounds.   No murmur heard. Pulmonary/Chest: Effort normal and breath sounds normal.  No respiratory distress. She has no wheezes. She has no rales.  Abdominal: Soft. Bowel sounds are normal. She exhibits no distension. There is no tenderness. There is no rebound and no guarding.  Musculoskeletal: Normal range of motion.  Neurological: She is alert and oriented to person, place, and time. Coordination normal.       Left-sided facial droop with forehead involvement. Subjectively decreased sensation to right face in all 3 distributions. Cranial nerves otherwise intact.  Skin: Skin is warm and dry.  Psychiatric: She has a normal mood and affect. Her speech is normal.    ED Course  Procedures (including critical care time)  Labs Reviewed - No  data to display Ct Head Wo Contrast  12/17/2011  *RADIOLOGY REPORT*  Clinical Data: Bilateral facial numbness.  CT HEAD WITHOUT CONTRAST  Technique:  Contiguous axial images were obtained from the base of the skull through the vertex without contrast.  Comparison: None.  Findings: Normal appearance of the intracranial structures.  No evidence for acute hemorrhage, mass lesion, midline shift, hydrocephalus or large infarct.  No acute bony abnormalities.  IMPRESSION: No acute intracranial abnormality.  Original Report Authenticated By: Richarda Overlie, M.D.     1. Bell's palsy   2. Rt facial numbness       MDM  Left-sided facial droop. Right-sided facial numbness. Likely Bell's palsy, complicated by the contralateral facial numbness. CT was done and was normal. Patient was not willing to get an MRI done. She said her daughter had 02 nausea. She'll be discharged to follow with her primary care Dr.        Juliet Rude. Rubin Payor, MD 12/17/11 1944

## 2011-12-17 NOTE — ED Provider Notes (Deleted)
  Physical Exam  BP 132/71  Pulse 69  Temp(Src) 98.1 F (36.7 C) (Oral)  Resp 20  SpO2 97%  Physical Exam  ED Course  Procedures  MDM   patient with left-sided facial droop. Right-sided facial numbness. She states it started when she woke up this morning. No headaches. She states she's not had pain like this before. With the combination of left-sided facial drooping contralateral numbness. CT was ordered. Patient removed in exam room     Juliet Rude. Rubin Payor, MD 12/17/11 1456

## 2011-12-17 NOTE — ED Notes (Signed)
Pt.has lt. Facial droop when she smiles.

## 2011-12-17 NOTE — Discharge Instructions (Signed)
Follow with her doctor in 2 days.  Bell's Palsy Bell's palsy is a condition in which the muscles on one side of the face cannot move (paralysis). This is because the nerves in the face are paralyzed. It is most often thought to be caused by a virus. The virus causes swelling of the nerve that controls movement on one side of the face. The nerve travels through a tight space surrounded by bone. When the nerve swells, it can be compressed by the bone. This results in damage to the protective covering around the nerve. This damage interferes with how the nerve communicates with the muscles of the face. As a result, it can cause weakness or paralysis of the facial muscles.  Injury (trauma), tumor, and surgery may cause Bell's palsy, but most of the time the cause is unknown. It is a relatively common condition. It starts suddenly (abrupt onset) with the paralysis usually ending within 2 days. Bell's palsy is not dangerous. But because the eye does not close properly, you may need care to keep the eye from getting dry. This can include splinting (to keep the eye shut) or moistening with artificial tears. Bell's palsy very seldom occurs on both sides of the face at the same time. SYMPTOMS   Eyebrow sagging.   Drooping of the eyelid and corner of the mouth.   Inability to close one eye.   Loss of taste on the front of the tongue.   Sensitivity to loud noises.  TREATMENT  The treatment is usually non-surgical. If the patient is seen within the first 24 to 48 hours, a short course of steroids may be prescribed, in an attempt to shorten the length of the condition. Antiviral medicines may also be used with the steroids, but it is unclear if they are helpful.  You will need to protect your eye, if you cannot close it. The cornea (clear covering over your eye) will become dry and can be damaged. Artificial tears can be used to keep your eye moist. Glasses or an eye patch should be worn to protect your  eye. PROGNOSIS  Recovery is variable, ranging from days to months. Although the problem usually goes away completely (about 80% of cases resolve), predicting the outcome is impossible. Most people improve within 3 weeks of when the symptoms began. Improvement may continue for 3 to 6 months. A small number of people have moderate to severe weakness that is permanent.  HOME CARE INSTRUCTIONS   If your caregiver prescribed medication to reduce swelling in the nerve, use as directed. Do not stop taking the medication unless directed by your caregiver.   Use moisturizing eye drops as needed to prevent drying of your eye, as directed by your caregiver.   Protect your eye, as directed by your caregiver.   Use facial massage and exercises, as directed by your caregiver.   Perform your normal activities, and get your normal rest.  SEEK IMMEDIATE MEDICAL CARE IF:   There is pain, redness or irritation in the eye.   You or your child has an oral temperature above 102 F (38.9 C), not controlled by medicine.  MAKE SURE YOU:   Understand these instructions.   Will watch your condition.   Will get help right away if you are not doing well or get worse.  Document Released: 08/22/2005 Document Revised: 08/11/2011 Document Reviewed: 08/31/2009 [facility] Patient Information 2012 [place], [place].

## 2011-12-17 NOTE — ED Notes (Signed)
CBG 82 tested by glucometer in the ED

## 2011-12-17 NOTE — ED Notes (Signed)
Discharge instructions reviewed with pt; verbalizes understanding.  No questions asked; no further c/o's voiced.  Pt ambulatory to lobby.  NAD noted. 

## 2011-12-22 LAB — GLUCOSE, CAPILLARY: Glucose-Capillary: 82 mg/dL (ref 70–99)

## 2012-12-10 ENCOUNTER — Other Ambulatory Visit (HOSPITAL_COMMUNITY): Payer: Self-pay | Admitting: Obstetrics

## 2012-12-10 DIAGNOSIS — Z1231 Encounter for screening mammogram for malignant neoplasm of breast: Secondary | ICD-10-CM

## 2012-12-11 ENCOUNTER — Ambulatory Visit (HOSPITAL_COMMUNITY)
Admission: RE | Admit: 2012-12-11 | Discharge: 2012-12-11 | Disposition: A | Discharge status: Home / Self Care | Payer: Managed Care, Other (non HMO) | Source: Ambulatory Visit | Attending: Obstetrics | Admitting: Obstetrics

## 2012-12-11 DIAGNOSIS — Z1231 Encounter for screening mammogram for malignant neoplasm of breast: Secondary | ICD-10-CM | POA: Insufficient documentation

## 2012-12-20 ENCOUNTER — Ambulatory Visit (HOSPITAL_COMMUNITY): Payer: Managed Care, Other (non HMO)

## 2014-02-14 ENCOUNTER — Other Ambulatory Visit (HOSPITAL_COMMUNITY): Payer: Self-pay | Admitting: Obstetrics

## 2014-02-14 DIAGNOSIS — Z1231 Encounter for screening mammogram for malignant neoplasm of breast: Secondary | ICD-10-CM

## 2014-02-26 ENCOUNTER — Ambulatory Visit (HOSPITAL_COMMUNITY)
Admission: RE | Admit: 2014-02-26 | Discharge: 2014-02-26 | Disposition: A | Discharge status: Home / Self Care | Payer: BC Managed Care – PPO | Source: Ambulatory Visit | Attending: Obstetrics | Admitting: Obstetrics

## 2014-02-26 DIAGNOSIS — Z1231 Encounter for screening mammogram for malignant neoplasm of breast: Secondary | ICD-10-CM | POA: Insufficient documentation

## 2015-01-09 ENCOUNTER — Encounter: Payer: Self-pay | Admitting: Gastroenterology

## 2015-02-24 ENCOUNTER — Other Ambulatory Visit (HOSPITAL_COMMUNITY): Payer: Self-pay | Admitting: Obstetrics

## 2015-02-24 ENCOUNTER — Encounter: Payer: Self-pay | Admitting: Gastroenterology

## 2015-02-24 DIAGNOSIS — Z1231 Encounter for screening mammogram for malignant neoplasm of breast: Secondary | ICD-10-CM

## 2015-03-02 ENCOUNTER — Ambulatory Visit (HOSPITAL_COMMUNITY): Payer: BC Managed Care – PPO | Attending: Obstetrics

## 2015-03-24 ENCOUNTER — Ambulatory Visit (AMBULATORY_SURGERY_CENTER): Payer: Self-pay | Admitting: *Deleted

## 2015-03-24 ENCOUNTER — Ambulatory Visit (HOSPITAL_COMMUNITY)
Admission: RE | Admit: 2015-03-24 | Discharge: 2015-03-24 | Disposition: A | Discharge status: Home / Self Care | Payer: BC Managed Care – PPO | Source: Ambulatory Visit | Attending: Obstetrics | Admitting: Obstetrics

## 2015-03-24 VITALS — Ht 65.0 in | Wt 226.0 lb

## 2015-03-24 DIAGNOSIS — Z1231 Encounter for screening mammogram for malignant neoplasm of breast: Secondary | ICD-10-CM

## 2015-03-24 DIAGNOSIS — Z800 Family history of malignant neoplasm of digestive organs: Secondary | ICD-10-CM

## 2015-03-24 MED ORDER — NA SULFATE-K SULFATE-MG SULF 17.5-3.13-1.6 GM/177ML PO SOLN: 1.0000 | Freq: Once | ORAL | Status: DC

## 2015-03-24 NOTE — Progress Notes (Signed)
No egg or soy allergy No issues with past sedation. With general anesthesia has had post op N/V No home 02 use No diet pills emmi declined

## 2015-04-06 DIAGNOSIS — D126 Benign neoplasm of colon, unspecified: Secondary | ICD-10-CM

## 2015-04-06 HISTORY — DX: Benign neoplasm of colon, unspecified: D12.6

## 2015-04-07 ENCOUNTER — Encounter: Payer: Self-pay | Admitting: Gastroenterology

## 2015-04-07 ENCOUNTER — Ambulatory Visit (AMBULATORY_SURGERY_CENTER): Payer: BC Managed Care – PPO | Admitting: Gastroenterology

## 2015-04-07 VITALS — BP 123/64 | HR 70 | Temp 97.8°F | Resp 17 | Ht 65.0 in | Wt 226.0 lb

## 2015-04-07 DIAGNOSIS — D124 Benign neoplasm of descending colon: Secondary | ICD-10-CM | POA: Diagnosis not present

## 2015-04-07 DIAGNOSIS — Z800 Family history of malignant neoplasm of digestive organs: Secondary | ICD-10-CM | POA: Diagnosis not present

## 2015-04-07 DIAGNOSIS — Z1211 Encounter for screening for malignant neoplasm of colon: Secondary | ICD-10-CM

## 2015-04-07 LAB — GLUCOSE, CAPILLARY
GLUCOSE-CAPILLARY: 100 mg/dL — AB (ref 65–99)
Glucose-Capillary: 92 mg/dL (ref 65–99)

## 2015-04-07 MED ORDER — SODIUM CHLORIDE 0.9 % IV SOLN: 500.0000 mL | INTRAVENOUS | Status: DC

## 2015-04-07 NOTE — Patient Instructions (Signed)
YOU HAD AN ENDOSCOPIC PROCEDURE TODAY AT THE [facility]:   Refer to the procedure report that was given to you for any specific questions about what was found during the examination.  If the procedure report does not answer your questions, please call your gastroenterologist to clarify.  If you requested that your care partner not be given the details of your procedure findings, then the procedure report has been included in a sealed envelope for you to review at your convenience later.  YOU SHOULD EXPECT: Some feelings of bloating in the abdomen. Passage of more gas than usual.  Walking can help get rid of the air that was put into your GI tract during the procedure and reduce the bloating. If you had a lower endoscopy (such as a colonoscopy or flexible sigmoidoscopy) you may notice spotting of blood in your stool or on the toilet paper. If you underwent a bowel prep for your procedure, you may not have a normal bowel movement for a few days.  Please Note:  You might notice some irritation and congestion in your nose or some drainage.  This is from the oxygen used during your procedure.  There is no need for concern and it should clear up in a day or so.  SYMPTOMS TO REPORT IMMEDIATELY:   Following lower endoscopy (colonoscopy or flexible sigmoidoscopy):  Excessive amounts of blood in the stool  Significant tenderness or worsening of abdominal pains  Swelling of the abdomen that is new, acute  Fever of 100F or higher    For urgent or emergent issues, a gastroenterologist can be reached at any hour by calling [phone].   DIET: Your first meal following the procedure should be a small meal and then it is ok to progress to your normal diet. Heavy or fried foods are harder to digest and may make you feel nauseous or bloated.  Likewise, meals heavy in dairy and vegetables can increase bloating.  Drink plenty of fluids but you should avoid alcoholic beverages for 24  hours.  ACTIVITY:  You should plan to take it easy for the rest of today and you should NOT DRIVE or use heavy machinery until tomorrow (because of the sedation medicines used during the test).    FOLLOW UP: Our staff will call the number listed on your records the next business day following your procedure to check on you and address any questions or concerns that you may have regarding the information given to you following your procedure. If we do not reach you, we will leave a message.  However, if you are feeling well and you are not experiencing any problems, there is no need to return our call.  We will assume that you have returned to your regular daily activities without incident.  If any biopsies were taken you will be contacted by phone or by letter within the next 1-3 weeks.  Please call us at [phone] if you have not heard about the biopsies in 3 weeks.    SIGNATURES/CONFIDENTIALITY: You and/or your care partner have signed paperwork which will be entered into your electronic medical record.  These signatures attest to the fact that that the information above on your After Visit Summary has been reviewed and is understood.  Full responsibility of the confidentiality of this discharge information lies with you and/or your care-partner.  Resume medications. Information given on polyps and hemorrhoids. 

## 2015-04-07 NOTE — Progress Notes (Signed)
Report to PACU, RN, vss, BBS= Clear.  

## 2015-04-07 NOTE — Progress Notes (Signed)
Called to room to assist during endoscopic procedure.  Patient ID and intended procedure confirmed with present staff. Received instructions for my participation in the procedure from the performing physician.  

## 2015-04-07 NOTE — Op Note (Signed)
[name], [id number]   COLONOSCOPY PROCEDURE REPORT  PATIENT: Amanda Anthony, Amanda Anthony  MR#: [id number] BIRTHDATE: 06/23/1956 , 68  yrs. old GENDER: female ENDOSCOPIST: Ladene Artist, MD, [facility] PROCEDURE DATE:  04/07/2015 PROCEDURE:   Colonoscopy, screening and Colonoscopy with snare polypectomy First Screening Colonoscopy - Avg.  risk and is 50 yrs.  old or older - No.  Prior Negative Screening - Now for repeat screening. Less than 10 yrs Prior Negative Screening - Now for repeat screening.  Above average risk  History of Adenoma - Now for follow-up colonoscopy & has been > or = to 3 yrs.  N/A  Polyps removed today? Yes ASA CLASS:   Class II INDICATIONS:Screening for colonic neoplasia and FH Colon or Rectal Adenocarcinoma. MEDICATIONS: Monitored anesthesia care and Propofol 250 mg IV DESCRIPTION OF PROCEDURE:   After the risks benefits and alternatives of the procedure were thoroughly explained, informed consent was obtained.  The digital rectal exam revealed no abnormalities of the rectum.   The LB [id number]-H190 [id number]  endoscope was introduced through the anus and advanced to the cecum, which was identified by both the appendix and ileocecal valve. No adverse events experienced.   The quality of the prep was excellent. (Suprep was used)  The instrument was then slowly withdrawn as the colon was fully examined. Estimated blood loss is zero unless otherwise noted in this procedure report.    COLON FINDINGS: A sessile polyp measuring 6 mm in size was found in the descending colon.  A polypectomy was performed with a cold snare.  The resection was complete, the polyp tissue was completely retrieved and sent to histology.   The colonic mucosa appeared normal at the splenic flexure, in the transverse colon, rectum, sigmoid colon, at the ileocecal valve, cecum, hepatic flexure, and in the ascending colon.  Retroflexed views revealed internal Grade I  hemorrhoids. The time to cecum = 1.6 Withdrawal time = 10.7   The scope was withdrawn and the procedure completed. COMPLICATIONS: There were no immediate complications.  ENDOSCOPIC IMPRESSION: 1.   Sessile polyp in the descending colon; polypectomy performed with a cold snare 2.   The colon otherwise appeared normal 3.   Grade I internal hemorrhoids  RECOMMENDATIONS: 1.  Await pathology results 2.  Repeat Colonoscopy in 5 years.  eSigned:  Ladene Artist, MD, [facility] 04/07/2015 11:15 AM   cc: Celedonio Miyamoto, MD

## 2015-04-08 ENCOUNTER — Telehealth: Payer: Self-pay | Admitting: *Deleted

## 2015-04-08 NOTE — Telephone Encounter (Signed)
  Follow up Call-  Call back number 04/07/2015  Post procedure Call Back phone  # [phone]  Permission to leave phone message Yes     Patient questions:  Do you have a fever, pain , or abdominal swelling? No. Pain Score  0 *  Have you tolerated food without any problems? Yes.    Have you been able to return to your normal activities? Yes.    Do you have any questions about your discharge instructions: Diet   No. Medications  No. Follow up visit  No.  Do you have questions or concerns about your Care? No.  Actions: * If pain score is 4 or above: No action needed, pain <4.

## 2015-04-18 ENCOUNTER — Encounter: Payer: Self-pay | Admitting: Gastroenterology

## 2015-11-10 ENCOUNTER — Ambulatory Visit (INDEPENDENT_AMBULATORY_CARE_PROVIDER_SITE_OTHER): Payer: BC Managed Care – PPO | Admitting: Gastroenterology

## 2015-11-10 ENCOUNTER — Encounter: Payer: Self-pay | Admitting: Gastroenterology

## 2015-11-10 VITALS — BP 140/80 | HR 82 | Ht 65.0 in | Wt 224.8 lb

## 2015-11-10 DIAGNOSIS — Z86010 Personal history of colonic polyps: Secondary | ICD-10-CM | POA: Diagnosis not present

## 2015-11-10 DIAGNOSIS — R079 Chest pain, unspecified: Secondary | ICD-10-CM | POA: Diagnosis not present

## 2015-11-10 DIAGNOSIS — K219 Gastro-esophageal reflux disease without esophagitis: Secondary | ICD-10-CM

## 2015-11-10 MED ORDER — PANTOPRAZOLE SODIUM 40 MG PO TBEC: 40.0000 mg | DELAYED_RELEASE_TABLET | Freq: Two times a day (BID) | ORAL | Status: AC

## 2015-11-10 NOTE — Progress Notes (Signed)
    History of Present Illness: This is a 60 year old female with a several year history of GERD. She is currently maintained on pantoprazole 40 mg daily. She relates intermittent difficulties with throat clearing and hoarseness. She also has occasional problems with heartburn and coughing. She notes several foods such as popcorn, pepper or chocolate lead to coughing. She has had 3 fairly severe episodes of chest pain associated with thoracic back pain that began suddenly and lasted for couple hours. She was seen in the ED for her first episode without a clear etiology found. CBC, CMP, lipid panel, Mg, Vit D from 07/2015 unremarkable except for glucose=168.  Current Medications, Allergies, Past Medical History, Past Surgical History, Family History and Social History were reviewed in [employer] record.  Physical Exam: General: Well developed, well nourished, no acute distress Head: Normocephalic and atraumatic Eyes:  sclerae anicteric, EOMI Ears: Normal auditory acuity Mouth: No deformity or lesions Lungs: Clear throughout to auscultation Heart: Regular rate and rhythm; no murmurs, rubs or bruits Abdomen: Soft, non tender and non distended. No masses, hepatosplenomegaly or hernias noted. Normal Bowel sounds Musculoskeletal: Symmetrical with no gross deformities  Pulses:  Normal pulses noted Extremities: No clubbing, cyanosis, edema or deformities noted Neurological: Alert oriented x 4, grossly nonfocal Psychological:  Alert and cooperative. Normal mood and affect  Assessment and Recommendations:  1. GERD with suboptimal symptom control. Increase pantoprazole to 40 mg twice daily and strictly follow all antireflux measures. R/O esophagitis, Barrett's. Schedule EGD. The risks (including bleeding, perforation, infection, missed lesions, medication reactions and possible hospitalization or surgery if complications occur), benefits, and alternatives to endoscopy with  possible biopsy and possible dilation were discussed with the patient and they consent to proceed.   2. Intermittent chest pain associated with mid back pain. These symptoms are atypical for GERD. R/O cholelithiasis. Schedule abdominal ultrasound. If [place] is negative and these symptoms persist she should have further evaluation by her PCP.  3. Personal history of adenomatous colon polyps. A five-year interval surveillance colonoscopy is recommended August 2021.

## 2015-11-10 NOTE — Patient Instructions (Signed)
Increase your protonix to one tablet by mouth twice daily. A new prescription has been sent to your pharmacy.   Patient advised to avoid spicy, acidic, citrus, chocolate, mints, fruit and fruit juices.  Limit the intake of caffeine, alcohol and Soda.  Don't exercise too soon after eating.  Don't lie down within 3-4 hours of eating.  Elevate the head of your bed.  You have been scheduled for an endoscopy. Please follow written instructions given to you at your visit today. If you use inhalers (even only as needed), please bring them with you on the day of your procedure. Your physician has requested that you go to www.startemmi.com and enter the access code given to you at your visit today. This web site gives a general overview about your procedure. However, you should still follow specific instructions given to you by our office regarding your preparation for the procedure.  Thank you for choosing me and [facility].  Pricilla Riffle. Dagoberto Ligas., MD., [facility]  cc: Charletta Cousin, MD

## 2015-11-11 ENCOUNTER — Telehealth: Payer: Self-pay

## 2015-11-11 DIAGNOSIS — M546 Pain in thoracic spine: Secondary | ICD-10-CM

## 2015-11-11 DIAGNOSIS — R079 Chest pain, unspecified: Secondary | ICD-10-CM

## 2015-11-11 NOTE — Telephone Encounter (Signed)
Left a message for patient to return my call. 

## 2015-11-11 NOTE — Telephone Encounter (Signed)
-----   Message from Ladene Artist, MD sent at 11/10/2015  5:18 PM EST ----- Schedule abd [place] to evaluate intermittent chest pain associated with mid thoracic back pain.

## 2015-11-16 NOTE — Telephone Encounter (Signed)
Informed patient that Dr. Fuller Plan wanted to schedule her for an abdominal ultrasound. Patient verbalized understanding. Patient scheduled per her request for 12/04/15 at 9:30am. Patient is arrive at 9:15am and npo after midnight. Patient informed of date and time of ultrasound. Pt verbalized understanding.

## 2015-11-16 NOTE — Telephone Encounter (Signed)
Left a message for patient to return my call. 

## 2015-12-04 ENCOUNTER — Ambulatory Visit (HOSPITAL_COMMUNITY)
Admission: RE | Admit: 2015-12-04 | Discharge: 2015-12-04 | Disposition: A | Discharge status: Home / Self Care | Payer: BC Managed Care – PPO | Source: Ambulatory Visit | Attending: Gastroenterology | Admitting: Gastroenterology

## 2015-12-04 DIAGNOSIS — R079 Chest pain, unspecified: Secondary | ICD-10-CM | POA: Diagnosis not present

## 2015-12-04 DIAGNOSIS — K8020 Calculus of gallbladder without cholecystitis without obstruction: Secondary | ICD-10-CM | POA: Diagnosis not present

## 2015-12-04 DIAGNOSIS — M546 Pain in thoracic spine: Secondary | ICD-10-CM | POA: Diagnosis present

## 2015-12-04 DIAGNOSIS — N281 Cyst of kidney, acquired: Secondary | ICD-10-CM | POA: Insufficient documentation

## 2015-12-04 DIAGNOSIS — K760 Fatty (change of) liver, not elsewhere classified: Secondary | ICD-10-CM | POA: Insufficient documentation

## 2015-12-15 ENCOUNTER — Encounter: Payer: Self-pay | Admitting: Gastroenterology

## 2015-12-15 ENCOUNTER — Ambulatory Visit (AMBULATORY_SURGERY_CENTER): Payer: BC Managed Care – PPO | Admitting: Gastroenterology

## 2015-12-15 VITALS — BP 164/89 | HR 72 | Temp 98.4°F | Resp 12 | Ht 65.0 in | Wt 224.0 lb

## 2015-12-15 DIAGNOSIS — K317 Polyp of stomach and duodenum: Secondary | ICD-10-CM

## 2015-12-15 DIAGNOSIS — K219 Gastro-esophageal reflux disease without esophagitis: Secondary | ICD-10-CM

## 2015-12-15 LAB — GLUCOSE, CAPILLARY
GLUCOSE-CAPILLARY: 108 mg/dL — AB (ref 65–99)
GLUCOSE-CAPILLARY: 97 mg/dL (ref 65–99)

## 2015-12-15 MED ORDER — SODIUM CHLORIDE 0.9 % IV SOLN: 500.0000 mL | INTRAVENOUS | Status: DC

## 2015-12-15 NOTE — Progress Notes (Signed)
Called to room to assist during endoscopic procedure.  Patient ID and intended procedure confirmed with present staff. Received instructions for my participation in the procedure from the performing physician.  

## 2015-12-15 NOTE — Op Note (Signed)
[place] [place] Patient Name: Amanda Anthony Procedure Date: 12/15/2015 10:12 AM MRN: [id number] Endoscopist: Ladene Artist MD, MD Age: 60 Date of Birth: [birth year] Gender: Female Procedure:                Upper GI endoscopy Indications:              Gastro-esophageal reflux disease Medicines:                Monitored Anesthesia Care Procedure:                Pre-Anesthesia Assessment:                           - Prior to the procedure, a History and Physical                            was performed, and patient medications and                            allergies were reviewed. The patient's tolerance of                            previous anesthesia was also reviewed. The risks                            and benefits of the procedure and the sedation                            options and risks were discussed with the patient.                            All questions were answered, and informed consent                            was obtained. Prior Anticoagulants: The patient has                            taken no previous anticoagulant or antiplatelet                            agents. ASA Grade Assessment: II - A patient with                            mild systemic disease. After reviewing the risks                            and benefits, the patient was deemed in                            satisfactory condition to undergo the procedure.                           After obtaining informed consent, the endoscope was  passed under direct vision. Throughout the                            procedure, the patient's blood pressure, pulse, and                            oxygen saturations were monitored continuously. The                            Model GIF-[id number]) scope was introduced                            through the mouth, and advanced to the second part                            of duodenum. The upper GI endoscopy was              accomplished without difficulty. The patient                            tolerated the procedure well. Scope In: Scope Out: Findings:                 A few 3 to 6 mm sessile polyps with no bleeding and                            no stigmata of recent bleeding were found in the                            gastric body and fundus. Biopsies were taken with a                            cold forceps for histology of the larger polyps.                           The exam of the stomach was otherwise normal.                           A small hiatal hernia was present.                           Otherwise the cardia and gastric fundus were normal                            on retroflexion.                           The examined esophagus was normal.                           The duodenal bulb and second portion of the                            duodenum were normal. Complications:  No immediate complications. Estimated Blood Loss:     Estimated blood loss was minimal. Impression:               - A few gastric polyps. Biopsied.                           - Small hiatal hernia. Recommendation:           - Patient has a contact number available for                            emergencies. The signs and symptoms of potential                            delayed complications were discussed with the                            patient. Return to normal activities tomorrow.                            Written discharge instructions were provided to the                            patient.                           - Resume previous diet and antireflux measures.                           - Continue present medications.                           - Await pathology results. Ladene Artist MD, MD 12/15/2015 10:36:08 AM This report has been signed electronically.

## 2015-12-15 NOTE — Progress Notes (Signed)
Report to PACU, RN, vss, BBS= Clear.  

## 2015-12-15 NOTE — Progress Notes (Signed)
No problems noted in the recovery room. maw 

## 2015-12-15 NOTE — Patient Instructions (Addendum)
YOU HAD AN ENDOSCOPIC PROCEDURE TODAY AT [facility]:   Refer to the procedure report that was given to you for any specific questions about what was found during the examination.  If the procedure report does not answer your questions, please call your gastroenterologist to clarify.  If you requested that your care partner not be given the details of your procedure findings, then the procedure report has been included in a sealed envelope for you to review at your convenience later.  YOU SHOULD EXPECT: Some feelings of bloating in the abdomen. Passage of more gas than usual.  Walking can help get rid of the air that was put into your GI tract during the procedure and reduce the bloating. If you had a lower endoscopy (such as a colonoscopy or flexible sigmoidoscopy) you may notice spotting of blood in your stool or on the toilet paper. If you underwent a bowel prep for your procedure, you may not have a normal bowel movement for a few days.  Please Note:  You might notice some irritation and congestion in your nose or some drainage.  This is from the oxygen used during your procedure.  There is no need for concern and it should clear up in a day or so.  SYMPTOMS TO REPORT IMMEDIATELY:    Following upper endoscopy (EGD)  Vomiting of blood or coffee ground material  New chest pain or pain under the shoulder blades  Painful or persistently difficult swallowing  New shortness of breath  Fever of 100F or higher  Black, tarry-looking stools  For urgent or emergent issues, a gastroenterologist can be reached at any hour by calling [phone].   DIET: Your first meal following the procedure should be a small meal and then it is ok to progress to your normal diet. Heavy or fried foods are harder to digest and may make you feel nauseous or bloated.  Likewise, meals heavy in dairy and vegetables can increase bloating.  Drink plenty of fluids but you should avoid alcoholic beverages  for 24 hours.  ACTIVITY:  You should plan to take it easy for the rest of today and you should NOT DRIVE or use heavy machinery until tomorrow (because of the sedation medicines used during the test).    FOLLOW UP: Our staff will call the number listed on your records the next business day following your procedure to check on you and address any questions or concerns that you may have regarding the information given to you following your procedure. If we do not reach you, we will leave a message.  However, if you are feeling well and you are not experiencing any problems, there is no need to return our call.  We will assume that you have returned to your regular daily activities without incident.  If any biopsies were taken you will be contacted by phone or by letter within the next 1-3 weeks.  Please call us at [phone] if you have not heard about the biopsies in 3 weeks.    SIGNATURES/CONFIDENTIALITY: You and/or your care partner have signed paperwork which will be entered into your electronic medical record.  These signatures attest to the fact that that the information above on your After Visit Summary has been reviewed and is understood.  Full responsibility of the confidentiality of this discharge information lies with you and/or your care-partner.    Handouts were given to your care partner on a hiatal hernia and GERD. Your blood sugar was  97 on admission to the recovery room. You may resume your current medications today. Await biopsy results. Please call if any questions or concerns.

## 2015-12-16 ENCOUNTER — Telehealth: Payer: Self-pay | Admitting: *Deleted

## 2015-12-16 NOTE — Telephone Encounter (Signed)
  Follow up Call-  Call back number 12/15/2015 04/07/2015  Post procedure Call Back phone  # [phone]  Permission to leave phone message Yes Yes     Patient questions:  Do you have a fever, pain , or abdominal swelling? No. Pain Score  0 *  Have you tolerated food without any problems? Yes.    Have you been able to return to your normal activities? Yes.    Do you have any questions about your discharge instructions: Diet   No. Medications  No. Follow up visit  No.  Do you have questions or concerns about your Care? No.  Actions: * If pain score is 4 or above: No action needed, pain <4.

## 2015-12-28 ENCOUNTER — Encounter: Payer: Self-pay | Admitting: Gastroenterology

## 2016-03-07 ENCOUNTER — Other Ambulatory Visit: Payer: Self-pay | Admitting: Obstetrics

## 2016-03-07 DIAGNOSIS — Z1231 Encounter for screening mammogram for malignant neoplasm of breast: Secondary | ICD-10-CM

## 2016-03-24 ENCOUNTER — Ambulatory Visit
Admission: RE | Admit: 2016-03-24 | Discharge: 2016-03-24 | Disposition: A | Discharge status: Home / Self Care | Payer: BC Managed Care – PPO | Source: Ambulatory Visit | Attending: Obstetrics | Admitting: Obstetrics

## 2016-03-24 DIAGNOSIS — Z1231 Encounter for screening mammogram for malignant neoplasm of breast: Secondary | ICD-10-CM

## 2016-03-30 ENCOUNTER — Other Ambulatory Visit: Payer: Self-pay | Admitting: Obstetrics

## 2016-03-30 DIAGNOSIS — N6320 Unspecified lump in the left breast, unspecified quadrant: Secondary | ICD-10-CM

## 2016-04-07 ENCOUNTER — Other Ambulatory Visit: Payer: Self-pay | Admitting: Obstetrics

## 2016-04-07 DIAGNOSIS — Z1231 Encounter for screening mammogram for malignant neoplasm of breast: Secondary | ICD-10-CM

## 2016-04-15 ENCOUNTER — Ambulatory Visit: Payer: BC Managed Care – PPO

## 2016-04-22 ENCOUNTER — Ambulatory Visit
Admission: RE | Admit: 2016-04-22 | Discharge: 2016-04-22 | Disposition: A | Discharge status: Home / Self Care | Payer: BC Managed Care – PPO | Source: Ambulatory Visit | Attending: Obstetrics | Admitting: Obstetrics

## 2016-04-22 DIAGNOSIS — Z1231 Encounter for screening mammogram for malignant neoplasm of breast: Secondary | ICD-10-CM

## 2016-09-29 ENCOUNTER — Ambulatory Visit: Payer: BC Managed Care – PPO | Admitting: Gastroenterology

## 2017-03-13 ENCOUNTER — Other Ambulatory Visit: Payer: Self-pay | Admitting: Obstetrics

## 2017-03-13 DIAGNOSIS — F419 Anxiety disorder, unspecified: Secondary | ICD-10-CM

## 2017-03-13 DIAGNOSIS — F329 Major depressive disorder, single episode, unspecified: Secondary | ICD-10-CM | POA: Insufficient documentation

## 2017-03-13 DIAGNOSIS — Z1231 Encounter for screening mammogram for malignant neoplasm of breast: Secondary | ICD-10-CM

## 2017-03-13 DIAGNOSIS — F32A Depression, unspecified: Secondary | ICD-10-CM | POA: Insufficient documentation

## 2017-03-13 HISTORY — DX: Anxiety disorder, unspecified: F41.9

## 2017-03-14 DIAGNOSIS — R21 Rash and other nonspecific skin eruption: Secondary | ICD-10-CM

## 2017-03-14 HISTORY — DX: Rash and other nonspecific skin eruption: R21

## 2017-04-17 DIAGNOSIS — E7800 Pure hypercholesterolemia, unspecified: Secondary | ICD-10-CM | POA: Insufficient documentation

## 2017-04-17 DIAGNOSIS — I8390 Asymptomatic varicose veins of unspecified lower extremity: Secondary | ICD-10-CM

## 2017-04-17 HISTORY — DX: Pure hypercholesterolemia, unspecified: E78.00

## 2017-04-17 HISTORY — DX: Asymptomatic varicose veins of unspecified lower extremity: I83.90

## 2017-04-18 DIAGNOSIS — E538 Deficiency of other specified B group vitamins: Secondary | ICD-10-CM

## 2017-04-18 DIAGNOSIS — E139 Other specified diabetes mellitus without complications: Secondary | ICD-10-CM

## 2017-04-18 HISTORY — DX: Other specified diabetes mellitus without complications: E13.9

## 2017-04-18 HISTORY — DX: Deficiency of other specified B group vitamins: E53.8

## 2017-04-25 ENCOUNTER — Ambulatory Visit
Admission: RE | Admit: 2017-04-25 | Discharge: 2017-04-25 | Disposition: A | Discharge status: Home / Self Care | Payer: BC Managed Care – PPO | Source: Ambulatory Visit | Attending: Obstetrics | Admitting: Obstetrics

## 2017-04-25 DIAGNOSIS — Z1231 Encounter for screening mammogram for malignant neoplasm of breast: Secondary | ICD-10-CM

## 2017-04-25 DIAGNOSIS — N6089 Other benign mammary dysplasias of unspecified breast: Secondary | ICD-10-CM | POA: Insufficient documentation

## 2017-04-27 ENCOUNTER — Other Ambulatory Visit: Payer: Self-pay | Admitting: Obstetrics

## 2017-04-27 DIAGNOSIS — R928 Other abnormal and inconclusive findings on diagnostic imaging of breast: Secondary | ICD-10-CM

## 2017-05-01 ENCOUNTER — Ambulatory Visit
Admission: RE | Admit: 2017-05-01 | Discharge: 2017-05-01 | Disposition: A | Discharge status: Home / Self Care | Payer: BC Managed Care – PPO | Source: Ambulatory Visit | Attending: Obstetrics | Admitting: Obstetrics

## 2017-05-01 DIAGNOSIS — R928 Other abnormal and inconclusive findings on diagnostic imaging of breast: Secondary | ICD-10-CM

## 2017-05-17 DIAGNOSIS — G629 Polyneuropathy, unspecified: Secondary | ICD-10-CM

## 2017-05-17 HISTORY — DX: Polyneuropathy, unspecified: G62.9

## 2017-11-08 ENCOUNTER — Emergency Department (HOSPITAL_COMMUNITY): Payer: BC Managed Care – PPO

## 2017-11-08 ENCOUNTER — Emergency Department (HOSPITAL_COMMUNITY)
Admission: EM | Admit: 2017-11-08 | Discharge: 2017-11-08 | Disposition: A | Discharge status: Home / Self Care | Payer: BC Managed Care – PPO | Attending: Emergency Medicine | Admitting: Emergency Medicine

## 2017-11-08 ENCOUNTER — Other Ambulatory Visit: Payer: Self-pay

## 2017-11-08 ENCOUNTER — Encounter (HOSPITAL_COMMUNITY): Payer: Self-pay

## 2017-11-08 DIAGNOSIS — Z5321 Procedure and treatment not carried out due to patient leaving prior to being seen by health care provider: Secondary | ICD-10-CM | POA: Diagnosis not present

## 2017-11-08 DIAGNOSIS — R0602 Shortness of breath: Secondary | ICD-10-CM | POA: Diagnosis not present

## 2017-11-08 DIAGNOSIS — R110 Nausea: Secondary | ICD-10-CM | POA: Insufficient documentation

## 2017-11-08 DIAGNOSIS — R079 Chest pain, unspecified: Secondary | ICD-10-CM | POA: Insufficient documentation

## 2017-11-08 DIAGNOSIS — R1013 Epigastric pain: Secondary | ICD-10-CM | POA: Insufficient documentation

## 2017-11-08 DIAGNOSIS — R51 Headache: Secondary | ICD-10-CM | POA: Diagnosis not present

## 2017-11-08 LAB — CBC
HCT: 38.6 % (ref 36.0–46.0)
Hemoglobin: 12.2 g/dL (ref 12.0–15.0)
MCH: 26.6 pg (ref 26.0–34.0)
MCHC: 31.6 g/dL (ref 30.0–36.0)
MCV: 84.1 fL (ref 78.0–100.0)
Platelets: 259 10*3/uL (ref 150–400)
RBC: 4.59 MIL/uL (ref 3.87–5.11)
RDW: 15.2 % (ref 11.5–15.5)
WBC: 17.1 10*3/uL — ABNORMAL HIGH (ref 4.0–10.5)

## 2017-11-08 LAB — BASIC METABOLIC PANEL
Anion gap: 12 (ref 5–15)
BUN: 15 mg/dL (ref 6–20)
CO2: 19 mmol/L — AB (ref 22–32)
CREATININE: 0.87 mg/dL (ref 0.44–1.00)
Calcium: 9.6 mg/dL (ref 8.9–10.3)
Chloride: 106 mmol/L (ref 101–111)
GFR calc non Af Amer: >60 mL/min (ref >60)
GLUCOSE: 150 mg/dL — AB (ref 65–99)
Potassium: 4.7 mmol/L (ref 3.5–5.1)
Sodium: 137 mmol/L (ref 135–145)

## 2017-11-08 LAB — I-STAT TROPONIN, ED: Troponin i, poc: 0 ng/mL (ref 0.00–0.08)

## 2017-11-08 NOTE — ED Provider Notes (Cosign Needed)
Patient placed in Quick Look pathway, seen and evaluated   Chief Complaint: chest pain  HPI:   62 y.o. female presents to the ED with mid chest pain and epigastric pain that does not radiate. She states that the pain started today at work while walking. She c/o nausea, headache and shortness of breath.   ROS: Cardiac: chest pain  Physical Exam:BP (!) 151/92 (BP Location: Right Arm)   Pulse 81   Temp 97.9 F (36.6 C) (Oral)   Resp 16   Ht 5\' 5"  (1.651 m)   Wt 90.7 kg (200 lb)   SpO2 98%   BMI 33.28 kg/m    Gen: No distress  Neuro: Awake and Alert  Skin: Warm and dry  Heart: Regular rate and rhythm  Lungs without rales or wheezing.     Focused Exam:    Initiation of care has begun. The patient has been counseled on the process, plan, and necessity for staying for the completion/evaluation, and the remainder of the medical screening examination    Ashley Murrain, NP 11/08/17 1718

## 2017-11-08 NOTE — ED Notes (Signed)
Pt approached registrar at [facility] stating they were leaving. Registrar cut off arm band. RN notified.

## 2017-11-08 NOTE — ED Triage Notes (Signed)
Pt endorses generalized non radiating chest pain that began while walking at work this afternoon with nausea and headache and shob. VSS.

## 2017-11-12 DIAGNOSIS — G8929 Other chronic pain: Secondary | ICD-10-CM

## 2017-11-12 DIAGNOSIS — M25561 Pain in right knee: Secondary | ICD-10-CM

## 2017-11-12 DIAGNOSIS — M25562 Pain in left knee: Secondary | ICD-10-CM

## 2017-11-12 HISTORY — DX: Other chronic pain: G89.29

## 2017-11-15 DIAGNOSIS — Z87898 Personal history of other specified conditions: Secondary | ICD-10-CM | POA: Insufficient documentation

## 2017-11-15 DIAGNOSIS — R079 Chest pain, unspecified: Secondary | ICD-10-CM | POA: Insufficient documentation

## 2017-11-15 HISTORY — DX: Personal history of other specified conditions: Z87.898
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
## 2017-11-27 ENCOUNTER — Encounter: Payer: Self-pay | Admitting: Cardiology

## 2017-11-27 ENCOUNTER — Ambulatory Visit: Payer: BC Managed Care – PPO | Admitting: Cardiology

## 2017-11-27 VITALS — BP 140/86 | HR 74 | Ht 65.0 in | Wt 214.1 lb

## 2017-11-27 DIAGNOSIS — E109 Type 1 diabetes mellitus without complications: Secondary | ICD-10-CM | POA: Diagnosis not present

## 2017-11-27 DIAGNOSIS — R079 Chest pain, unspecified: Secondary | ICD-10-CM | POA: Diagnosis not present

## 2017-11-27 DIAGNOSIS — E139 Other specified diabetes mellitus without complications: Secondary | ICD-10-CM

## 2017-11-27 DIAGNOSIS — I10 Essential (primary) hypertension: Secondary | ICD-10-CM | POA: Diagnosis not present

## 2017-11-27 MED ORDER — ASPIRIN EC 81 MG PO TBEC: 81.0000 mg | DELAYED_RELEASE_TABLET | Freq: Every day | ORAL | 3 refills | Status: DC

## 2017-11-27 MED ORDER — NITROGLYCERIN 0.4 MG SL SUBL: 0.4000 mg | SUBLINGUAL_TABLET | SUBLINGUAL | 3 refills | Status: DC | PRN

## 2017-11-27 NOTE — Patient Instructions (Addendum)
Medication Instructions:  Your physician has recommended you make the following change in your medication:  START aspirin 81 mg daily START nitroglycerin as needed. If you are having chest pain, stop what you are doing and sit down. Put one nitroglycerin tablet under your tongue, let it dissolve, and wait 5 minutes. If you are still having chest pain, you can do this two more times. After you take a third nitroglycerin, call 911.   Labwork: None  Testing/Procedures: You had an EKG today.   Your physician has requested that you have cardiac CT. Cardiac computed tomography (CT) is a painless test that uses an x-ray machine to take clear, detailed pictures of your heart. For further information please visit [email or web address]. Please follow instruction sheet as given.   Follow-Up: Your physician recommends that you schedule a follow-up appointment in: 4 weeks.  Any Other Special Instructions Will Be Listed Below (If Applicable).     If you need a refill on your cardiac medications before your next appointment, please call your pharmacy.

## 2017-11-29 ENCOUNTER — Telehealth: Payer: Self-pay | Admitting: Cardiology

## 2017-11-29 NOTE — Telephone Encounter (Signed)
Patient advised that it does take a few weeks to get scheduled for cardiac CTA. Patient verbalized understanding, no further questions.

## 2017-11-29 NOTE — Telephone Encounter (Signed)
Patient called and was hoping tohear when her CT may be scheduled.. Please call her. I explained that they are having to get PRIOR auth from [employer] before scheduling.Marland Kitchen

## 2017-12-22 ENCOUNTER — Ambulatory Visit: Payer: BC Managed Care – PPO | Admitting: Cardiology

## 2018-02-05 ENCOUNTER — Telehealth: Payer: Self-pay

## 2018-02-05 DIAGNOSIS — N2889 Other specified disorders of kidney and ureter: Secondary | ICD-10-CM

## 2018-02-05 DIAGNOSIS — I151 Hypertension secondary to other renal disorders: Secondary | ICD-10-CM

## 2018-02-05 DIAGNOSIS — I10 Essential (primary) hypertension: Secondary | ICD-10-CM

## 2018-02-05 MED ORDER — METOPROLOL TARTRATE 50 MG PO TABS: 50.0000 mg | ORAL_TABLET | Freq: Once | ORAL | 0 refills | Status: DC

## 2018-02-05 NOTE — Telephone Encounter (Signed)
Patient returned call. Reviewed all instructions with patient that is on letter. Patient verbalized understanding of all instructions including the need for lab work. Advised patient letter did go out in the mail today and if she has any further questions or concerns to contact the office. Patient verbalized understanding. No further questions.

## 2018-02-05 NOTE — Telephone Encounter (Signed)
Contacted patient to review cardiac CTA instructions and need for lab work prior to test. Left message to return call on home and mobile number.

## 2018-02-13 LAB — BASIC METABOLIC PANEL
BUN / CREAT RATIO: 17 (ref 12–28)
BUN: 14 mg/dL (ref 8–27)
CHLORIDE: 101 mmol/L (ref 96–106)
CO2: 24 mmol/L (ref 20–29)
CREATININE: 0.83 mg/dL (ref 0.57–1.00)
Calcium: 9.5 mg/dL (ref 8.7–10.3)
GFR calc Af Amer: 88 mL/min/{1.73_m2} (ref >59)
GFR calc non Af Amer: 76 mL/min/{1.73_m2} (ref >59)
GLUCOSE: 58 mg/dL — AB (ref 65–99)
POTASSIUM: 4.6 mmol/L (ref 3.5–5.2)
SODIUM: 141 mmol/L (ref 134–144)

## 2018-02-15 ENCOUNTER — Ambulatory Visit (HOSPITAL_COMMUNITY)
Admission: RE | Admit: 2018-02-15 | Discharge: 2018-02-15 | Disposition: A | Discharge status: Home / Self Care | Payer: BC Managed Care – PPO | Source: Ambulatory Visit | Attending: Cardiology | Admitting: Cardiology

## 2018-02-15 DIAGNOSIS — K760 Fatty (change of) liver, not elsewhere classified: Secondary | ICD-10-CM | POA: Diagnosis not present

## 2018-02-15 DIAGNOSIS — R918 Other nonspecific abnormal finding of lung field: Secondary | ICD-10-CM | POA: Diagnosis not present

## 2018-02-15 DIAGNOSIS — R079 Chest pain, unspecified: Secondary | ICD-10-CM

## 2018-02-15 MED ORDER — NITROGLYCERIN 0.4 MG SL SUBL
SUBLINGUAL_TABLET | SUBLINGUAL | Status: AC
  Filled 2018-02-15: qty 2

## 2018-02-15 MED ORDER — IOPAMIDOL (ISOVUE-370) INJECTION 76%
100.0000 mL | Freq: Once | INTRAVENOUS | Status: AC | PRN
Start: 2018-02-15 — End: 2018-02-15
  Administered 2018-02-15: 80 mL via INTRAVENOUS

## 2018-02-15 MED ORDER — NITROGLYCERIN 0.4 MG SL SUBL
0.8000 mg | SUBLINGUAL_TABLET | Freq: Once | SUBLINGUAL | Status: AC
  Administered 2018-02-15: 0.8 mg via SUBLINGUAL
  Filled 2018-02-15: qty 25

## 2018-02-15 MED ORDER — METOPROLOL TARTRATE 5 MG/5ML IV SOLN
INTRAVENOUS | Status: AC
  Filled 2018-02-15: qty 5

## 2018-02-15 MED ORDER — IOPAMIDOL (ISOVUE-370) INJECTION 76%
INTRAVENOUS | Status: AC
  Filled 2018-02-15: qty 100

## 2018-02-15 MED ORDER — METOPROLOL TARTRATE 5 MG/5ML IV SOLN
5.0000 mg | Freq: Once | INTRAVENOUS | Status: DC
  Filled 2018-02-15: qty 5

## 2018-02-15 NOTE — Progress Notes (Signed)
Patient ambulatory out of department with steady gait noted. Voices no complaints.

## 2018-02-15 NOTE — Progress Notes (Signed)
CT complete. Patient eating crackers and drinking drink. Voices no complaints.

## 2018-02-19 NOTE — Progress Notes (Signed)
Cardiology Office Note:    Date:  02/20/2018   ID:  Amanda Anthony, DOB 1956/08/20, MRN [id number]  PCP:  Lara Mulch, NP  Cardiologist:  Shirlee More, MD    Referring MD: Lara Mulch*    ASSESSMENT:    1. Chest pain in adult   2. Essential hypertension   3. Hypercholesteremia    PLAN:    In order of problems listed above:  1. She has had no recurrent chest pain and a cardiac CTA is normal including calcium score.  There is no evidence of obstructive CAD I told her in the future she could take nitroglycerin she has a severe episode of chest pain she still is at risk for coronary artery spasm. 2. Stable continue current treatment 3. Stable continue current treatment   Next appointment: As needed   Medication Adjustments/Labs and Tests Ordered: Current medicines are reviewed at length with the patient today.  Concerns regarding medicines are outlined above.  No orders of the defined types were placed in this encounter.  No orders of the defined types were placed in this encounter.   Chief Complaint  Patient presents with  . Follow-up    after cardiac CTA  . Chest Pain    History of Present Illness:    Amanda Anthony is a 62 y.o. female with a hx of chest pain diabetes hypertension hyperlipidemia last seen 11/27/17.  ASSESSMENT:    1. Chest pain in adult   2. Essential hypertension   3. Diabetes 1.5, managed as type 2 ([place])    PLAN:    1. Typical exertional angina stable pattern she will referred to undergo cardiac CTA to guide treatment.  I asked her to start aspirin 81 mg daily and gave her a prescription for nitroglycerin as needed. 2. Stable continue current treatment 3. Stable managed by her PCP 4. We will continue her statin for dyslipidemia.  Cardiac CTA 02/05/18: IMPRESSION: 1. Coronary artery calcium score 0 Agatston units. This suggests low risk for future cardiac events. 2.  No coronary disease noted.  Compliance with  diet, lifestyle and medications: Yes  She has had no further chest pain despite her high pre-test risk obstructive CAD or cardiac CTA is normal. Past Medical History:  Diagnosis Date  . Abdominal pain, left lower quadrant 11/13/2007   Qualifier: Diagnosis of  By: Lurlean Nanny LPN, Regina    . Allergic rhinitis   . Anxiety 03/13/2017  . Chronic pain of both knees 11/12/2017  . Depression   . Diabetes 1.5, managed as type 2 ([place]) 04/18/2017  . Diabetes mellitus   . DIABETES MELLITUS, GESTATIONAL, HX OF 11/13/2007   Qualifier: Diagnosis of  By: Lurlean Nanny LPN, Regina    Overview:  Overview:  Qualifier: Diagnosis of  By: Lurlean Nanny LPN, Regina   . GERD (gastroesophageal reflux disease)   . HEMATOCHEZIA 11/13/2007   Qualifier: Diagnosis of  By: Lurlean Nanny LPN, Regina    . Hematochezia 11/13/2007   Overview:  Overview:  Qualifier: Diagnosis of  By: Lurlean Nanny LPN, Regina   . Hemorrhoids, internal 11/13/2007   Qualifier: Diagnosis of  By: Lurlean Nanny LPN, Regina    Overview:  Overview:  Qualifier: Diagnosis of  By: Lurlean Nanny LPN, Regina   . Hx of chest pain 11/15/2017  . Hypercholesteremia 04/17/2017  . Hyperlipidemia   . Hypertension 11/13/2007   Qualifier: Diagnosis of  By: Lurlean Nanny LPN, Regina    Overview:  Overview:  Qualifier: Diagnosis of  By: Lurlean Nanny LPN, Regina   .  Insomnia   . Neuropathy 05/17/2017  . Rash of groin 03/14/2017  . Residual hemorrhoidal skin tags 11/13/2007   Qualifier: Diagnosis of  By: Lurlean Nanny LPN, Regina    Overview:  Overview:  Qualifier: Diagnosis of  By: Lurlean Nanny LPN, Regina   . Tubular adenoma of colon 04/2015  . 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 Vitamin B 12 deficiency 04/18/2017  . Vitamin B12 deficiency   . Vitamin D deficiency     Past Surgical History:  Procedure Laterality Date  . APPENDECTOMY    . COLONOSCOPY  2011   hyperplastic polyps  . ENDOMETRIAL CRYOABLATION     laser to removed the endometriosis  . TONSILLECTOMY AND ADENOIDECTOMY      Current Medications: Current Meds  Medication Sig  . aspirin EC 81  MG tablet Take 1 tablet (81 mg total) by mouth daily.  . calcium gluconate 500 MG tablet Take 1 tablet by mouth daily.  . Coenzyme Q10 (CO Q 10) 100 MG CAPS Take 1 capsule by mouth daily.  . cyanocobalamin (,VITAMIN B-12,) 1000 MCG/ML injection inject as directed every 30 days  . escitalopram (LEXAPRO) 20 MG tablet Take 10 mg by mouth daily.   . Ferrous Sulfate Dried (FERROUS SULFATE IRON PO) Take 1 tablet by mouth daily.  Marland Kitchen gabapentin (NEURONTIN) 300 MG capsule Take 300 mg by mouth daily.  . metFORMIN (GLUCOPHAGE-XR) 500 MG 24 hr tablet Take 500 mg by mouth daily with supper.  . montelukast (SINGULAIR) 10 MG tablet Take 1 tablet (10 mg total) by mouth daily.  . nitroGLYCERIN (NITROSTAT) 0.4 MG SL tablet Place 1 tablet (0.4 mg total) under the tongue every 5 (five) minutes as needed for chest pain.  . pantoprazole (PROTONIX) 40 MG tablet Take 1 tablet (40 mg total) by mouth 2 (two) times daily. (Patient taking differently: Take 40 mg by mouth daily as needed (heartburn). )  . pravastatin (PRAVACHOL) 40 MG tablet Take 40 mg by mouth daily.  . traZODone (DESYREL) 100 MG tablet Take 100 mg by mouth at bedtime.     Allergies:   Zocor [simvastatin]   Social History   Socioeconomic History  . Marital status: Married    Spouse name: Not on file  . Number of children: 1  . Years of education: Not on file  . Highest education level: Not on file  Occupational History  . 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 Financial resource strain: Not on file  . Food insecurity:    Worry: Not on file    Inability: Not on file  . Transportation needs:    Medical: Not on file    Non-medical: Not on file  Tobacco Use  . Smoking status: Never Smoker  . Smokeless tobacco: Never Used  Substance and Sexual Activity  . Alcohol use: No    Alcohol/week: 0.0 oz  . Drug use: No  . Sexual activity: Yes    Birth control/protection: None  Lifestyle  . Physical activity:    Days per week:  Not on file    Minutes per session: Not on file  . Stress: Not on file  Relationships  . Social connections:    Talks on phone: Not on file    Gets together: Not on file    Attends religious service: Not on file    Active member of club or organization: Not on file    Attends meetings of clubs or organizations: Not on file    Relationship status: Not  on file  Other Topics Concern  . Not on file  Social History Narrative  . Not on file     Family History: The patient's family history includes Breast cancer in her paternal grandmother; Colon cancer in her maternal aunt and maternal grandmother; Diabetes in her father; Diverticulitis in her father; Heart attack in her father and mother; Heart disease in her mother; Lung cancer in her sister. ROS:   Please see the history of present illness.    All other systems reviewed and are negative.  EKGs/Labs/Other Studies Reviewed:    The following studies were reviewed today:    Recent Labs: 11/08/2017: Hemoglobin 12.2; Platelets 259 02/12/2018: BUN 14; Creatinine, Ser 0.83; Potassium 4.6; Sodium 141  Recent Lipid Panel    Component Value Date/Time   CHOL  07/29/2010 0545    200        ATP III CLASSIFICATION:  <200     mg/dL   Desirable  200-239  mg/dL   Borderline High  >=240    mg/dL   High          TRIG 166 (H) 07/29/2010 0545   HDL 47 07/29/2010 0545   CHOLHDL 4.3 07/29/2010 0545   VLDL 33 07/29/2010 0545   LDLCALC (H) 07/29/2010 0545    120        Total Cholesterol/HDL:CHD Risk Coronary Heart Disease Risk Table                     Men   Women  1/2 Average Risk   3.4   3.3  Average Risk       5.0   4.4  2 X Average Risk   9.6   7.1  3 X Average Risk  23.4   11.0        Use the calculated Patient Ratio above and the CHD Risk Table to determine the patient's CHD Risk.        ATP III CLASSIFICATION (LDL):  <100     mg/dL   Optimal  100-129  mg/dL   Near or Above                    Optimal  130-159  mg/dL    Borderline  160-189  mg/dL   High  >190     mg/dL   Very High    Physical Exam:    VS:  BP 132/90 (BP Location: Right Arm, Patient Position: Sitting, Cuff Size: Large)   Pulse 78   Ht 5\' 5"  (1.651 m)   Wt 216 lb 12.8 oz (98.3 kg)   SpO2 98%   BMI 36.08 kg/m     Wt Readings from Last 3 Encounters:  02/20/18 216 lb 12.8 oz (98.3 kg)  11/27/17 214 lb 1.9 oz (97.1 kg)  12/15/15 224 lb (101.6 kg)     GEN:  Well nourished, well developed in no acute distress HEENT: Normal NECK: No JVD; No carotid bruits LYMPHATICS: No lymphadenopathy CARDIAC: RRR, no murmurs, rubs, gallops RESPIRATORY:  Clear to auscultation without rales, wheezing or rhonchi  ABDOMEN: Soft, non-tender, non-distended MUSCULOSKELETAL:  No edema; No deformity  SKIN: Warm and dry NEUROLOGIC:  Alert and oriented x 3 PSYCHIATRIC:  Normal affect    Signed, Shirlee More, MD  02/20/2018 10:13 AM    [place]

## 2018-02-20 ENCOUNTER — Ambulatory Visit: Payer: BC Managed Care – PPO | Admitting: Cardiology

## 2018-02-20 ENCOUNTER — Encounter: Payer: Self-pay | Admitting: Cardiology

## 2018-02-20 VITALS — BP 132/90 | HR 78 | Ht 65.0 in | Wt 216.8 lb

## 2018-02-20 DIAGNOSIS — I10 Essential (primary) hypertension: Secondary | ICD-10-CM

## 2018-02-20 DIAGNOSIS — E7800 Pure hypercholesterolemia, unspecified: Secondary | ICD-10-CM

## 2018-02-20 DIAGNOSIS — R079 Chest pain, unspecified: Secondary | ICD-10-CM | POA: Diagnosis not present

## 2018-02-20 NOTE — Patient Instructions (Signed)

## 2018-04-02 ENCOUNTER — Other Ambulatory Visit: Payer: Self-pay | Admitting: Obstetrics

## 2018-04-02 DIAGNOSIS — Z1231 Encounter for screening mammogram for malignant neoplasm of breast: Secondary | ICD-10-CM

## 2018-04-26 ENCOUNTER — Ambulatory Visit: Payer: BC Managed Care – PPO

## 2018-04-26 ENCOUNTER — Other Ambulatory Visit: Payer: Self-pay | Admitting: Obstetrics and Gynecology

## 2018-04-26 DIAGNOSIS — N644 Mastodynia: Secondary | ICD-10-CM

## 2018-05-16 ENCOUNTER — Ambulatory Visit
Admission: RE | Admit: 2018-05-16 | Discharge: 2018-05-16 | Disposition: A | Discharge status: Home / Self Care | Payer: BC Managed Care – PPO | Source: Ambulatory Visit | Attending: Obstetrics and Gynecology | Admitting: Obstetrics and Gynecology

## 2018-05-16 ENCOUNTER — Ambulatory Visit: Payer: BC Managed Care – PPO

## 2018-05-16 DIAGNOSIS — N644 Mastodynia: Secondary | ICD-10-CM

## 2018-11-07 ENCOUNTER — Ambulatory Visit (INDEPENDENT_AMBULATORY_CARE_PROVIDER_SITE_OTHER): Payer: BC Managed Care – PPO

## 2018-11-07 ENCOUNTER — Ambulatory Visit: Payer: BC Managed Care – PPO | Admitting: Sports Medicine

## 2018-11-07 ENCOUNTER — Encounter: Payer: Self-pay | Admitting: Sports Medicine

## 2018-11-07 ENCOUNTER — Telehealth: Payer: Self-pay | Admitting: *Deleted

## 2018-11-07 VITALS — BP 145/85 | HR 77 | Resp 16 | Ht 65.0 in | Wt 205.0 lb

## 2018-11-07 DIAGNOSIS — M79672 Pain in left foot: Secondary | ICD-10-CM

## 2018-11-07 DIAGNOSIS — R58 Hemorrhage, not elsewhere classified: Secondary | ICD-10-CM

## 2018-11-07 DIAGNOSIS — R079 Chest pain, unspecified: Secondary | ICD-10-CM

## 2018-11-07 DIAGNOSIS — S96912A Strain of unspecified muscle and tendon at ankle and foot level, left foot, initial encounter: Secondary | ICD-10-CM

## 2018-11-07 NOTE — Progress Notes (Signed)
Subjective: Amanda Anthony is a 63 y.o. female patient who presents to office for evaluation of left foot pain. Patient complains of progressive pain especially over the last week reports that she stepped on a back of holes and turn her foot over and since this injury has had significant bruising developing in the arch reports that she cannot put any weight or stand on the front part of her foot has been walking on her heel due to the pain in her arch states that it has been swelling and there has been constant pain 6 out of 10 has tried icing for a few days and Tylenol admits to a past history of plantar fasciitis 5 years ago where she received injections at her heel and does admit to a history of diabetes was diagnosed in 2011 and her blood sugar today was 104 last A1c 5.8 and last visit to primary care doctor was 1 month ago.  Review of Systems  Musculoskeletal: Positive for joint pain and myalgias.       Gait problem  Skin:       Bruising and swelling  All other systems reviewed and are negative.   Patient Active Problem List   Diagnosis Date Noted  . Chest pain in adult 11/15/2017  . Chronic pain of both knees 11/12/2017  . Neuropathy 05/17/2017  . 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 Vitamin B 12 deficiency 04/18/2017  . Hypercholesteremia 04/17/2017  . 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 Rash of groin 03/14/2017  . Anxiety 03/13/2017  . Depression 03/13/2017  . Hypertension 11/13/2007  . Hemorrhoids, internal 11/13/2007  . Residual hemorrhoidal skin tags 11/13/2007  . HEMATOCHEZIA 11/13/2007  . ABDOMINAL PAIN, LEFT LOWER QUADRANT 11/13/2007  . DIABETES MELLITUS, GESTATIONAL, HX OF 11/13/2007  . Hematochezia 11/13/2007    Current Outpatient Medications on File Prior to Visit  Medication Sig Dispense Refill  . aspirin EC 81 MG tablet Take 1 tablet (81 mg total) by mouth daily. 90 tablet 3  . calcium gluconate 500 MG tablet Take 1 tablet by mouth daily.    .  Coenzyme Q10 (CO Q 10) 100 MG CAPS Take 1 capsule by mouth daily.    . cyanocobalamin (,VITAMIN B-12,) 1000 MCG/ML injection inject as directed every 30 days  11  . escitalopram (LEXAPRO) 20 MG tablet Take 10 mg by mouth daily.     . Ferrous Sulfate Dried (FERROUS SULFATE IRON PO) Take 1 tablet by mouth daily.    Marland Kitchen gabapentin (NEURONTIN) 300 MG capsule Take 300 mg by mouth daily.    . metFORMIN (GLUCOPHAGE-XR) 500 MG 24 hr tablet Take 500 mg by mouth daily with supper.    . montelukast (SINGULAIR) 10 MG tablet Take 1 tablet (10 mg total) by mouth daily.  3  . pantoprazole (PROTONIX) 40 MG tablet Take 1 tablet (40 mg total) by mouth 2 (two) times daily. (Patient taking differently: Take 40 mg by mouth daily as needed (heartburn). ) 60 tablet 11  . pravastatin (PRAVACHOL) 40 MG tablet Take 40 mg by mouth daily.    . traZODone (DESYREL) 100 MG tablet Take 100 mg by mouth at bedtime.    . nitroGLYCERIN (NITROSTAT) 0.4 MG SL tablet Place 1 tablet (0.4 mg total) under the tongue every 5 (five) minutes as needed for chest pain. 25 tablet 3   No current facility-administered medications on file prior to visit.     Allergies  Allergen Reactions  . Zocor [Simvastatin]  Other (See Comments)    Arm and shoulder pain    Objective:  General: Alert and oriented x3 in no acute distress  Dermatology: Significant bruising in the arch of the left foot extending to the plantar surface of the forefoot, no open lesions bilateral lower extremities, no webspace macerations,  all nails x 10 are well manicured.  Vascular: Dorsalis Pedis and Posterior Tibial pedal pulses palpable, Capillary Fill Time 3 seconds,(+) pedal hair growth bilateral, no edema bilateral lower extremities, Temperature gradient within normal limits.  Neurology: Gross sensation intact via light touch bilateral, Protective sensation intact  with Thornell Mule Monofilament to all pedal sites.  Musculoskeletal: Mild tenderness with palpation  at left arch at area of concern, mild guarding with range of motion to left due to pain.  Gait: Antalgic gait  Xrays  Left foot   Impression: No fracture or dislocation, there is significant inferior calcaneal heel spur and mild soft tissue swelling of the plantar fascia otherwise no acute findings.  Assessment and Plan: Problem List Items Addressed This Visit    None    Visit Diagnoses    Tear of tendon of left foot, initial encounter    -  Primary   Left foot pain       Relevant Orders   DG Foot Complete Left   Ecchymosis           -Complete examination performed -Xrays reviewed -Discussed treatement options for possible foot sprain versus plantar fascial tear however due to the nature of the pain and ecchymosis likely patient has torn her tendon -Rx cam boot and advised patient to rest ice elevate and if no relief with Tylenol to switch to using Aleve or Motrin -Order musculoskeletal ultrasound to evaluate for tear of plantar fascia left arch -Patient to return to office after ultrasound or sooner if condition worsens.  Landis Martins, DPM

## 2018-11-07 NOTE — Telephone Encounter (Signed)
Unable to leave a message mobile mailbox is full. Left message on pt's home phone with 11/09/2018 [facility] Imaging appt and contact [phone].

## 2018-11-07 NOTE — Telephone Encounter (Addendum)
Pt called states she sees that our office called twice, but she did not get a message. Pt left no phone number.

## 2018-11-07 NOTE — Telephone Encounter (Signed)
I called pt on the home phone and explained I was unable to leave a message on the mobile phone the mailbox was full, but did leave a message including her radiology appt on the home phone, and asked if she could take the information now. Pt states she sees the phone light blinking and will check. I told pt she could call me again if needed.

## 2018-11-07 NOTE — Telephone Encounter (Signed)
-----   Message from Landis Martins, [place] sent at 11/07/2018  9:15 AM EST ----- Regarding: [facility] Ultrasound Eval for tear of plantar fascia

## 2018-11-07 NOTE — Telephone Encounter (Signed)
[place] scheduled [place] left foot 03/062020 arrive 2:00pm, imaging 2:30pm. Faxed to [place].

## 2018-11-07 NOTE — Progress Notes (Signed)
   Subjective:    Patient ID: Amanda Anthony, female    DOB: 1955-10-23, 63 y.o.   MRN: [id number]  HPI    Review of Systems  Musculoskeletal: Positive for arthralgias, gait problem, joint swelling and myalgias.  All other systems reviewed and are negative.      Objective:   Physical Exam        Assessment & Plan:

## 2018-11-08 ENCOUNTER — Other Ambulatory Visit: Payer: Self-pay | Admitting: Sports Medicine

## 2018-11-08 DIAGNOSIS — S96912A Strain of unspecified muscle and tendon at ankle and foot level, left foot, initial encounter: Secondary | ICD-10-CM

## 2018-11-08 DIAGNOSIS — M79672 Pain in left foot: Secondary | ICD-10-CM

## 2018-11-13 ENCOUNTER — Encounter: Payer: Self-pay | Admitting: Sports Medicine

## 2018-11-14 ENCOUNTER — Telehealth: Payer: Self-pay | Admitting: *Deleted

## 2018-11-14 NOTE — Telephone Encounter (Signed)
-----   Message from [place], [place] sent at 11/13/2018  6:20 PM EDT ----- Please let patient know that there is no tear to her plantar fascia and that her ultrasound only suggest hematoma in the arch. Continue with rest, ice, elevation and protection of foot to reduce pain. Hematoma resolve on there own but because it is in the arch can be painful. Follow up in 2 weeks if pain is not better or if symptoms change -Dr. Cannon Kettle

## 2018-11-14 NOTE — Telephone Encounter (Signed)
Unable to leave a message on mobile phone voice mail box is full. I spoke with pt and informed of Dr. Leeanne Rio review of [place] and orders. Pt states she will call again for an appt.

## 2018-11-26 ENCOUNTER — Telehealth: Payer: Self-pay | Admitting: *Deleted

## 2018-11-26 NOTE — Telephone Encounter (Signed)
Patient is upset we are rescheduling her wants to speak with you on a phone visit if at all possible.  She is still having a lot of pain and swelling nothing seems to be helping.  Explained to patient the new protocols coming down from [facility] and that even if we could see her they are not doing non emergent imaging.  Please advise.

## 2018-11-27 NOTE — Telephone Encounter (Signed)
Called patient and discuss with her the importance of rest, ice, elevation and wearing her CAM boot. Patient reports pain to top of her foot as well with difficulty getting on a shoe. I advised her to refrain from wearing a normal shoe if its uncomfortable and to use compression wrap to get the swelling down. I offered Tramadol if needed for patient of which patient states she will continue with her anti-inflammatories for now. I encouraged patient to call back if she changes her mind. Patient also asks if there is a medication for swelling. I advised patient that she possibly can try Lasix but she will have to discuss with her PCP this medication. Patient asks when will the schedule go back to normal and I advised her that at this time we are unsure and are adhering to [facility] and CDC guidelines; patient expressed understanding and  thanked me for calling her. -Dr. Cannon Kettle

## 2018-11-28 ENCOUNTER — Ambulatory Visit: Payer: BC Managed Care – PPO | Admitting: Sports Medicine

## 2018-12-11 ENCOUNTER — Telehealth: Payer: Self-pay | Admitting: Sports Medicine

## 2018-12-11 MED ORDER — TRAMADOL HCL 50 MG PO TABS: 50.0000 mg | ORAL_TABLET | Freq: Three times a day (TID) | ORAL | 0 refills | Status: DC | PRN

## 2018-12-11 NOTE — Telephone Encounter (Signed)
I called pt and explained it was not unusual to continue to have pain from a torn tendon even a month later and the discoloration maybe from the slow dissipation of the bruise leaving a lower limb. Pt states the [place] office canceled her appt to discuss results with Dr. Cannon Kettle. I told pt I would inform Dr. Cannon Kettle of the concerns and the availability of [place] results.

## 2018-12-11 NOTE — Telephone Encounter (Signed)
Please let patient know there is no tear just deep bruising which is a type of soft tissue injury on [place]. I offered Tramadol but patient declined the last time I spoke with her on the phone. If patient is still having pain see if she wants to try the tramadol we can send it and make sure patient is resting, icing, elevating, and using her boot. If pain is no better in 2 weeks then she should call for a follow up appointment. -Dr Chauncey Cruel

## 2018-12-11 NOTE — Telephone Encounter (Signed)
I informed pt of Dr. Leeanne Rio orders and pt states understanding and would like to take the Tramadol. Orders called to [facility].

## 2018-12-11 NOTE — Addendum Note (Signed)
Addended by: Harriett Sine D on: 12/11/2018 03:57 PM   Modules accepted: Orders

## 2018-12-11 NOTE — Telephone Encounter (Signed)
Pt called stating that she was diagnosed with a severe bruise on the bottom of her foot but it has been over a month and she is still experiencing pain. Pt also states that her foot appears to be a different color than the other and is worried due to her being a diabetic. Please give the patient a call.

## 2018-12-20 ENCOUNTER — Other Ambulatory Visit: Payer: Self-pay

## 2018-12-20 ENCOUNTER — Encounter: Payer: Self-pay | Admitting: Sports Medicine

## 2018-12-20 ENCOUNTER — Ambulatory Visit: Payer: BC Managed Care – PPO | Admitting: Sports Medicine

## 2018-12-20 VITALS — Temp 97.9°F | Resp 16

## 2018-12-20 DIAGNOSIS — S93602S Unspecified sprain of left foot, sequela: Secondary | ICD-10-CM

## 2018-12-20 DIAGNOSIS — M79672 Pain in left foot: Secondary | ICD-10-CM

## 2018-12-20 DIAGNOSIS — M779 Enthesopathy, unspecified: Secondary | ICD-10-CM

## 2018-12-20 DIAGNOSIS — M7752 Other enthesopathy of left foot: Secondary | ICD-10-CM

## 2018-12-20 DIAGNOSIS — R58 Hemorrhage, not elsewhere classified: Secondary | ICD-10-CM

## 2018-12-20 MED ORDER — OXYCODONE-ACETAMINOPHEN 7.5-325 MG PO TABS: 1.0000 | ORAL_TABLET | Freq: Three times a day (TID) | ORAL | 0 refills | Status: DC | PRN

## 2018-12-20 MED ORDER — TRIAMCINOLONE ACETONIDE 10 MG/ML IJ SUSP
10.0000 mg | Freq: Once | INTRAMUSCULAR | Status: AC
  Administered 2018-12-20: 10 mg

## 2018-12-20 NOTE — Progress Notes (Signed)
Subjective: Amanda Anthony is a 63 y.o. female patient who returns to office for evaluation of left foot pain. Patient reports that bruise is better but her pain is worse it hurts now even when she is not on her foot, sharp 6/10 constant pain at the inside of her foot and arch with swelling. Patient has been using Cam boot, icing, biofreeze, hemp and tramadol and nothing is helping. Denies nausea, vomiting, fever, chills, SOB.  FBS 104 A1c 5.8  Patient Active Problem List   Diagnosis Date Noted  . Chest pain in adult 11/15/2017  . Chronic pain of both knees 11/12/2017  . Neuropathy 05/17/2017  . 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 Vitamin B 12 deficiency 04/18/2017  . Hypercholesteremia 04/17/2017  . 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 Rash of groin 03/14/2017  . Anxiety 03/13/2017  . Depression 03/13/2017  . Hypertension 11/13/2007  . Hemorrhoids, internal 11/13/2007  . Residual hemorrhoidal skin tags 11/13/2007  . HEMATOCHEZIA 11/13/2007  . ABDOMINAL PAIN, LEFT LOWER QUADRANT 11/13/2007  . DIABETES MELLITUS, GESTATIONAL, HX OF 11/13/2007  . Hematochezia 11/13/2007    Current Outpatient Medications on File Prior to Visit  Medication Sig Dispense Refill  . aspirin EC 81 MG tablet Take 1 tablet (81 mg total) by mouth daily. 90 tablet 3  . calcium gluconate 500 MG tablet Take 1 tablet by mouth daily.    . Coenzyme Q10 (CO Q 10) 100 MG CAPS Take 1 capsule by mouth daily.    . cyanocobalamin (,VITAMIN B-12,) 1000 MCG/ML injection inject as directed every 30 days  11  . escitalopram (LEXAPRO) 20 MG tablet Take 10 mg by mouth daily.     . Ferrous Sulfate Dried (FERROUS SULFATE IRON PO) Take 1 tablet by mouth daily.    Marland Kitchen gabapentin (NEURONTIN) 300 MG capsule Take 300 mg by mouth daily.    . metFORMIN (GLUCOPHAGE-XR) 500 MG 24 hr tablet Take 500 mg by mouth daily with supper.    . montelukast (SINGULAIR) 10 MG tablet Take 1 tablet (10 mg total) by mouth daily.  3  .  nitroGLYCERIN (NITROSTAT) 0.4 MG SL tablet Place 1 tablet (0.4 mg total) under the tongue every 5 (five) minutes as needed for chest pain. 25 tablet 3  . pantoprazole (PROTONIX) 40 MG tablet Take 1 tablet (40 mg total) by mouth 2 (two) times daily. (Patient taking differently: Take 40 mg by mouth daily as needed (heartburn). ) 60 tablet 11  . pravastatin (PRAVACHOL) 40 MG tablet Take 40 mg by mouth daily.    . traMADol (ULTRAM) 50 MG tablet Take 1 tablet (50 mg total) by mouth every 8 (eight) hours as needed. 30 tablet 0  . traZODone (DESYREL) 100 MG tablet Take 100 mg by mouth at bedtime.     No current facility-administered medications on file prior to visit.     Allergies  Allergen Reactions  . Zocor [Simvastatin] Other (See Comments)    Arm and shoulder pain    Objective:  General: Alert and oriented x3 in no acute distress  Dermatology: Resolved bruising in the arch of the left foot, no open lesions bilateral lower extremities, no webspace macerations,  all nails x 10 are well manicured.  Vascular: Dorsalis Pedis and Posterior Tibial pedal pulses palpable, Capillary Fill Time 3 seconds,(+) pedal hair growth bilateral, 1+ pitting edema on left, Temperature gradient within normal limits.  Neurology: Gross sensation intact via light touch bilateral, Protective sensation intact  with  Semmes Weinstein Monofilament to all pedal sites.  Musculoskeletal: Mild tenderness with palpation at left arch at area of concern that increases to moderate pain at PT tendon, mild guarding with range of motion to left due to pain.  Assessment and Plan: Problem List Items Addressed This Visit    None    Visit Diagnoses    Foot sprain, left, sequela    -  Primary   Relevant Medications   oxyCODONE-acetaminophen (PERCOCET) 7.5-325 MG tablet   Left foot pain       Ecchymosis       Capsulitis       Tendonitis       Relevant Medications   triamcinolone acetonide (KENALOG) 10 MG/ML injection 10 mg       -Complete examination performed -Discussed treatement options for tendonitis/capsulitis with possible foot sprain since pain is continuing even though ecchymosis is better -After oral consent and aseptic prep, injected a mixture containing 1 ml of 2%  plain lidocaine, 1 ml 0.5% plain marcaine, 0.5 ml of kenalog 10 and 0.5 ml of dexamethasone phosphate along the PT tendon course on the left without complication. Post-injection care discussed with patient.  -Dispensed Surgitube compression sleeve to assist with edema control -Recommend continue with rest, ice, elevation -Rx Percocet for pain since Tramadol failed to offer relief -MRI ordered for further evaluation of PT tendon and fascia since patient continues to have symptoms after 6 weeks of conservative care -Continue with CAM boot -Patient to return to office after MRI or sooner if condition worsens.  Landis Martins, DPM

## 2018-12-21 ENCOUNTER — Telehealth: Payer: Self-pay | Admitting: *Deleted

## 2018-12-21 DIAGNOSIS — S96912A Strain of unspecified muscle and tendon at ankle and foot level, left foot, initial encounter: Secondary | ICD-10-CM

## 2018-12-21 DIAGNOSIS — M79672 Pain in left foot: Secondary | ICD-10-CM

## 2018-12-21 DIAGNOSIS — M779 Enthesopathy, unspecified: Secondary | ICD-10-CM

## 2018-12-21 DIAGNOSIS — R58 Hemorrhage, not elsewhere classified: Secondary | ICD-10-CM

## 2018-12-21 DIAGNOSIS — S93602S Unspecified sprain of left foot, sequela: Secondary | ICD-10-CM

## 2018-12-21 NOTE — Telephone Encounter (Addendum)
[facility] scheduled [phone] MRI left foot 12/25/2018 arrive 11:45am for 12:00pm imaging. Faxed orders to [facility].

## 2018-12-21 NOTE — Telephone Encounter (Signed)
Orders to J. Quintana, RN for pre-cert. 

## 2018-12-21 NOTE — Telephone Encounter (Signed)
-----   Message from [place], [place] sent at 12/20/2018  3:58 PM EDT ----- Regarding: MRI L Foot Continued pain to left arch and medial foot and ankle Eval PT tendon and fascia for tear Previous US only showed hematoma

## 2018-12-21 NOTE — Telephone Encounter (Signed)
Unable to leave a message on mobile there was no voicemail available. Left message on pt's home phone with 12/25/2018 [facility] Imaging appt.

## 2018-12-27 ENCOUNTER — Encounter: Payer: Self-pay | Admitting: Sports Medicine

## 2018-12-27 ENCOUNTER — Encounter: Payer: Self-pay | Admitting: *Deleted

## 2018-12-27 ENCOUNTER — Telehealth: Payer: Self-pay | Admitting: *Deleted

## 2018-12-27 NOTE — Telephone Encounter (Signed)
Tried to call patient to give MRI results patients voicemail is full. Will try again later.

## 2018-12-31 NOTE — Telephone Encounter (Signed)
Pt states she missed a call from our office and it may be concerning the MRI results.

## 2019-01-07 ENCOUNTER — Telehealth: Payer: Self-pay | Admitting: *Deleted

## 2019-01-07 NOTE — Telephone Encounter (Signed)
I called pt and stated the [facility] had tried to contact her. I told pt after I had contacted her with the MRI appt, between that time and her MRI the [place] office staff had begun to call pts with their results. I told pt I would send a message to Dr. Cannon Kettle and if she gave me results today I would call again otherwise it would be tomorrow.

## 2019-01-07 NOTE — Telephone Encounter (Signed)
Mel will you call her again Thanks Dr. Chauncey Cruel

## 2019-01-07 NOTE — Telephone Encounter (Signed)
Pt called and states she has yet to hear the results of her MRI, and she knows her voicemail was full, but she had left multiple ways to be contacted.

## 2019-01-08 NOTE — Telephone Encounter (Signed)
Left message on mobile and home phone informing pt I would discuss MRI results to call [phone].

## 2019-01-09 ENCOUNTER — Telehealth: Payer: Self-pay | Admitting: *Deleted

## 2019-01-09 NOTE — Telephone Encounter (Signed)
Finally after many attempts spoke with patient and gave her the results of her of her MRI  Per Dr Cannon Kettle patient stated understanding of instructions scheduled her for a follow up appointment for serial x-rays

## 2019-01-09 NOTE — Telephone Encounter (Signed)
Pt called and she is calling for results and will have her cell phone on all day.

## 2019-01-09 NOTE — Telephone Encounter (Signed)
Spoke with patient.

## 2019-02-01 ENCOUNTER — Ambulatory Visit: Payer: BC Managed Care – PPO | Admitting: Sports Medicine

## 2019-03-17 DIAGNOSIS — R030 Elevated blood-pressure reading, without diagnosis of hypertension: Secondary | ICD-10-CM | POA: Diagnosis not present

## 2019-03-17 DIAGNOSIS — E1169 Type 2 diabetes mellitus with other specified complication: Secondary | ICD-10-CM | POA: Diagnosis not present

## 2019-03-17 DIAGNOSIS — N1330 Unspecified hydronephrosis: Secondary | ICD-10-CM | POA: Diagnosis not present

## 2019-03-17 DIAGNOSIS — N201 Calculus of ureter: Secondary | ICD-10-CM

## 2019-03-18 DIAGNOSIS — N201 Calculus of ureter: Secondary | ICD-10-CM | POA: Diagnosis not present

## 2019-03-18 DIAGNOSIS — N1330 Unspecified hydronephrosis: Secondary | ICD-10-CM | POA: Diagnosis not present

## 2019-03-18 DIAGNOSIS — E1169 Type 2 diabetes mellitus with other specified complication: Secondary | ICD-10-CM | POA: Diagnosis not present

## 2019-03-18 DIAGNOSIS — R030 Elevated blood-pressure reading, without diagnosis of hypertension: Secondary | ICD-10-CM | POA: Diagnosis not present

## 2019-03-18 HISTORY — PX: KIDNEY STONE SURGERY: SHX686

## 2019-03-25 ENCOUNTER — Other Ambulatory Visit: Payer: Self-pay | Admitting: Urology

## 2019-03-25 ENCOUNTER — Encounter (HOSPITAL_COMMUNITY): Payer: Self-pay

## 2019-03-25 NOTE — Patient Instructions (Addendum)
DUE TO COVID-19 ONLY ONE VISITOR IS ALLOWED IN THE HOSPITAL AT THIS TIME   COVID SWAB TESTING MUST BE COMPLETED ON:  Today, March 26, 2019 at 11:00AM (Must self quarantine after testing. Follow instructions on handout.)   Your procedure is scheduled on: Wednesday, March 27, 2019   Surgery Time:  7:29AM-8:42AM   Report to [facility] Main  Entrance   Report to Short Stay at 5:30 AM   Call this number if you have problems the morning of surgery [phone]   Do not eat food or drink liquids :After Midnight.   Brush your teeth the morning of surgery.   Do NOT smoke after Midnight   Take these medicines the morning of surgery with A SIP OF WATER: Escitalopram, Gabapentin, Pantoprazole, Pravastatin  DO NOT TAKE ANY DIABETIC MEDICATIONS DAY OF YOUR SURGERY                               You may not have any metal on your body including hair pins, jewelry, and body piercings             Do not wear make-up, lotions, powders, perfumes/cologne, or deodorant             Do not wear nail polish.  Do not shave  48 hours prior to surgery.                Do not bring valuables to the hospital. [place].   Contacts, dentures or bridgework may not be worn into surgery.   Patients discharged the day of surgery will not be allowed to drive home.   Special Instructions: Bring a copy of your healthcare power of attorney and living will documents         the day of surgery if you haven't scanned them in before.              Please read over the following fact sheets you were given:  [facility] - Preparing for Surgery Before surgery, you can play an important role.  Because skin is not sterile, your skin needs to be as free of germs as possible.  You can reduce the number of germs on your skin by washing with CHG (chlorahexidine gluconate) soap before surgery.  CHG is an antiseptic cleaner which kills germs and bonds with the skin to  continue killing germs even after washing. Please DO NOT use if you have an allergy to CHG or antibacterial soaps.  If your skin becomes reddened/irritated stop using the CHG and inform your nurse when you arrive at Short Stay. Do not shave (including legs and underarms) for at least 48 hours prior to the first CHG shower.  You may shave your face/neck.  Please follow these instructions carefully:  1.  Shower with CHG Soap the night before surgery and the  morning of surgery.  2.  If you choose to wash your hair, wash your hair first as usual with your normal  shampoo.  3.  After you shampoo, rinse your hair and body thoroughly to remove the shampoo.                             4.  Use CHG as you would any other liquid soap.  You can apply chg directly to the skin and wash.  Gently with a scrungie or clean washcloth.  5.  Apply the CHG Soap to your body ONLY FROM THE NECK DOWN.   Do   not use on face/ open                           Wound or open sores. Avoid contact with eyes, ears mouth and   genitals (private parts).                       Wash face,  Genitals (private parts) with your normal soap.             6.  Wash thoroughly, paying special attention to the area where your    surgery  will be performed.  7.  Thoroughly rinse your body with warm water from the neck down.  8.  DO NOT shower/wash with your normal soap after using and rinsing off the CHG Soap.                9.  Pat yourself dry with a clean towel.            10.  Wear clean pajamas.            11.  Place clean sheets on your bed the night of your first shower and do not  sleep with pets. Day of Surgery : Do not apply any lotions/deodorants the morning of surgery.  Please wear clean clothes to the hospital/surgery center.  FAILURE TO FOLLOW THESE INSTRUCTIONS MAY RESULT IN THE CANCELLATION OF YOUR SURGERY  PATIENT SIGNATURE_________________________________  NURSE  SIGNATURE__________________________________  ________________________________________________________________________

## 2019-03-26 ENCOUNTER — Other Ambulatory Visit (HOSPITAL_COMMUNITY)
Admission: RE | Admit: 2019-03-26 | Discharge: 2019-03-26 | Disposition: A | Discharge status: Home / Self Care | Payer: BC Managed Care – PPO | Source: Ambulatory Visit | Attending: Urology | Admitting: Urology

## 2019-03-26 ENCOUNTER — Inpatient Hospital Stay (HOSPITAL_COMMUNITY)
Admission: RE | Admit: 2019-03-26 | Discharge: 2019-03-26 | Disposition: A | Discharge status: Home / Self Care | Payer: BC Managed Care – PPO | Source: Ambulatory Visit

## 2019-03-26 ENCOUNTER — Other Ambulatory Visit: Payer: Self-pay

## 2019-03-26 ENCOUNTER — Encounter (HOSPITAL_COMMUNITY): Payer: Self-pay

## 2019-03-26 DIAGNOSIS — Z1159 Encounter for screening for other viral diseases: Secondary | ICD-10-CM | POA: Insufficient documentation

## 2019-03-26 HISTORY — DX: Nausea with vomiting, unspecified: R11.2

## 2019-03-26 HISTORY — DX: Personal history of urinary calculi: Z87.442

## 2019-03-26 HISTORY — DX: Other specified postprocedural states: Z98.890

## 2019-03-26 LAB — SARS CORONAVIRUS 2 (TAT 6-24 HRS): SARS Coronavirus 2: NEGATIVE

## 2019-03-26 NOTE — Progress Notes (Signed)
SPOKE W/  [name]     SCREENING SYMPTOMS OF COVID 19:   COUGH--NO  RUNNY NOSE--- NO  SORE THROAT---NO  NASAL CONGESTION----NO  SNEEZING----NO  SHORTNESS OF BREATH---NO  DIFFICULTY BREATHING---NO  TEMP >100.0 -----NO  UNEXPLAINED BODY ACHES------NO  CHILLS -------- NO  HEADACHES ---------NO  LOSS OF SMELL/ TASTE --------NO    HAVE YOU OR ANY FAMILY MEMBER TRAVELLED PAST 14 DAYS OUT OF THE   COUNTY---lives in [place] STATE----NO COUNTRY----NO  HAVE YOU OR ANY FAMILY MEMBER BEEN EXPOSED TO ANYONE WITH COVID 19? NO

## 2019-03-27 ENCOUNTER — Encounter (HOSPITAL_COMMUNITY)
Admission: RE | Disposition: A | Discharge status: Home / Self Care | Payer: Self-pay | Source: Home / Self Care | Attending: Urology

## 2019-03-27 ENCOUNTER — Ambulatory Visit (HOSPITAL_COMMUNITY): Payer: BC Managed Care – PPO | Admitting: Physician Assistant

## 2019-03-27 ENCOUNTER — Other Ambulatory Visit: Payer: Self-pay

## 2019-03-27 ENCOUNTER — Ambulatory Visit (HOSPITAL_COMMUNITY): Payer: BC Managed Care – PPO

## 2019-03-27 ENCOUNTER — Ambulatory Visit (HOSPITAL_COMMUNITY)
Admission: RE | Admit: 2019-03-27 | Discharge: 2019-03-27 | Disposition: A | Discharge status: Home / Self Care | Payer: BC Managed Care – PPO | Attending: Urology | Admitting: Urology

## 2019-03-27 ENCOUNTER — Encounter (HOSPITAL_COMMUNITY): Payer: Self-pay | Admitting: Emergency Medicine

## 2019-03-27 DIAGNOSIS — F419 Anxiety disorder, unspecified: Secondary | ICD-10-CM | POA: Diagnosis not present

## 2019-03-27 DIAGNOSIS — Z7984 Long term (current) use of oral hypoglycemic drugs: Secondary | ICD-10-CM | POA: Diagnosis not present

## 2019-03-27 DIAGNOSIS — Z79899 Other long term (current) drug therapy: Secondary | ICD-10-CM | POA: Diagnosis not present

## 2019-03-27 DIAGNOSIS — E119 Type 2 diabetes mellitus without complications: Secondary | ICD-10-CM | POA: Diagnosis not present

## 2019-03-27 DIAGNOSIS — F329 Major depressive disorder, single episode, unspecified: Secondary | ICD-10-CM | POA: Diagnosis not present

## 2019-03-27 DIAGNOSIS — I10 Essential (primary) hypertension: Secondary | ICD-10-CM | POA: Diagnosis not present

## 2019-03-27 DIAGNOSIS — K219 Gastro-esophageal reflux disease without esophagitis: Secondary | ICD-10-CM | POA: Insufficient documentation

## 2019-03-27 DIAGNOSIS — N3941 Urge incontinence: Secondary | ICD-10-CM | POA: Insufficient documentation

## 2019-03-27 DIAGNOSIS — Z6835 Body mass index (BMI) 35.0-35.9, adult: Secondary | ICD-10-CM | POA: Diagnosis not present

## 2019-03-27 DIAGNOSIS — N201 Calculus of ureter: Secondary | ICD-10-CM | POA: Insufficient documentation

## 2019-03-27 HISTORY — PX: CYSTOSCOPY/URETEROSCOPY/HOLMIUM LASER/STENT PLACEMENT: SHX6546

## 2019-03-27 LAB — BASIC METABOLIC PANEL
Anion gap: 12 (ref 5–15)
BUN: 12 mg/dL (ref 8–23)
CO2: 23 mmol/L (ref 22–32)
Calcium: 8.9 mg/dL (ref 8.9–10.3)
Chloride: 103 mmol/L (ref 98–111)
Creatinine, Ser: 0.91 mg/dL (ref 0.44–1.00)
GFR calc Af Amer: >60 mL/min (ref >60)
GFR calc non Af Amer: >60 mL/min (ref >60)
Glucose, Bld: 144 mg/dL — ABNORMAL HIGH (ref 70–99)
Potassium: 3.5 mmol/L (ref 3.5–5.1)
Sodium: 138 mmol/L (ref 135–145)

## 2019-03-27 LAB — CBC
HCT: 41.4 % (ref 36.0–46.0)
Hemoglobin: 12.7 g/dL (ref 12.0–15.0)
MCH: 26.8 pg (ref 26.0–34.0)
MCHC: 30.7 g/dL (ref 30.0–36.0)
MCV: 87.5 fL (ref 80.0–100.0)
Platelets: 280 10*3/uL (ref 150–400)
RBC: 4.73 MIL/uL (ref 3.87–5.11)
RDW: 15.5 % (ref 11.5–15.5)
WBC: 7.2 10*3/uL (ref 4.0–10.5)
nRBC: 0 % (ref 0.0–0.2)

## 2019-03-27 LAB — HEMOGLOBIN A1C
Hgb A1c MFr Bld: 7.2 % — ABNORMAL HIGH (ref 4.8–5.6)
Mean Plasma Glucose: 159.94 mg/dL

## 2019-03-27 LAB — GLUCOSE, CAPILLARY
Glucose-Capillary: 143 mg/dL — ABNORMAL HIGH (ref 70–99)
Glucose-Capillary: 145 mg/dL — ABNORMAL HIGH (ref 70–99)

## 2019-03-27 SURGERY — CYSTOSCOPY/URETEROSCOPY/HOLMIUM LASER/STENT PLACEMENT
Anesthesia: General | Site: Bladder | Laterality: Left

## 2019-03-27 MED ORDER — ACETAMINOPHEN 160 MG/5ML PO SOLN: 1000.0000 mg | Freq: Once | ORAL | Status: DC | PRN

## 2019-03-27 MED ORDER — PHENAZOPYRIDINE HCL 200 MG PO TABS: 200.0000 mg | ORAL_TABLET | Freq: Three times a day (TID) | ORAL | 0 refills | Status: DC | PRN

## 2019-03-27 MED ORDER — TRAMADOL HCL 50 MG PO TABS: 50.0000 mg | ORAL_TABLET | Freq: Four times a day (QID) | ORAL | 0 refills | Status: DC | PRN

## 2019-03-27 MED ORDER — SODIUM CHLORIDE 0.9 % IV SOLN
INTRAVENOUS | Status: DC | PRN
  Administered 2019-03-27: 4 mL

## 2019-03-27 MED ORDER — PROPOFOL 10 MG/ML IV BOLUS
INTRAVENOUS | Status: AC
  Filled 2019-03-27: qty 20

## 2019-03-27 MED ORDER — DEXAMETHASONE SODIUM PHOSPHATE 10 MG/ML IJ SOLN
INTRAMUSCULAR | Status: DC | PRN
  Administered 2019-03-27: 10 mg via INTRAVENOUS

## 2019-03-27 MED ORDER — LIDOCAINE HCL URETHRAL/MUCOSAL 2 % EX GEL
CUTANEOUS | Status: DC | PRN
  Administered 2019-03-27 (×2): 1

## 2019-03-27 MED ORDER — PROPOFOL 500 MG/50ML IV EMUL
INTRAVENOUS | Status: DC | PRN
  Administered 2019-03-27: 100 ug/kg/min via INTRAVENOUS

## 2019-03-27 MED ORDER — LACTATED RINGERS IV SOLN
INTRAVENOUS | Status: DC
  Administered 2019-03-27: 06:00:00 via INTRAVENOUS

## 2019-03-27 MED ORDER — PROPOFOL 10 MG/ML IV BOLUS
INTRAVENOUS | Status: DC | PRN
  Administered 2019-03-27: 10 mg via INTRAVENOUS
  Administered 2019-03-27: 150 mg via INTRAVENOUS

## 2019-03-27 MED ORDER — GENTAMICIN SULFATE 40 MG/ML IJ SOLN
360.0000 mg | INTRAVENOUS | Status: AC
  Administered 2019-03-27: 360 mg via INTRAVENOUS
  Filled 2019-03-27: qty 9

## 2019-03-27 MED ORDER — BELLADONNA ALKALOIDS-OPIUM 16.2-30 MG RE SUPP
RECTAL | Status: AC
  Filled 2019-03-27: qty 1

## 2019-03-27 MED ORDER — PHENYLEPHRINE 40 MCG/ML (10ML) SYRINGE FOR IV PUSH (FOR BLOOD PRESSURE SUPPORT)
PREFILLED_SYRINGE | INTRAVENOUS | Status: DC | PRN
  Administered 2019-03-27 (×2): 80 ug via INTRAVENOUS
  Administered 2019-03-27: 40 ug via INTRAVENOUS

## 2019-03-27 MED ORDER — ACETAMINOPHEN 500 MG PO TABS: 1000.0000 mg | ORAL_TABLET | Freq: Once | ORAL | Status: DC | PRN

## 2019-03-27 MED ORDER — SCOPOLAMINE 1 MG/3DAYS TD PT72
MEDICATED_PATCH | TRANSDERMAL | Status: DC | PRN
  Administered 2019-03-27: 1 via TRANSDERMAL

## 2019-03-27 MED ORDER — MIDAZOLAM HCL 2 MG/2ML IJ SOLN
INTRAMUSCULAR | Status: AC
  Filled 2019-03-27: qty 2

## 2019-03-27 MED ORDER — CIPROFLOXACIN HCL 500 MG PO TABS: 500.0000 mg | ORAL_TABLET | Freq: Once | ORAL | 0 refills | Status: AC

## 2019-03-27 MED ORDER — LIDOCAINE 2% (20 MG/ML) 5 ML SYRINGE
INTRAMUSCULAR | Status: DC | PRN
  Administered 2019-03-27: 50 mg via INTRAVENOUS

## 2019-03-27 MED ORDER — OXYCODONE HCL 5 MG PO TABS: 5.0000 mg | ORAL_TABLET | Freq: Once | ORAL | Status: DC | PRN

## 2019-03-27 MED ORDER — FENTANYL CITRATE (PF) 100 MCG/2ML IJ SOLN: 25.0000 ug | INTRAMUSCULAR | Status: DC | PRN

## 2019-03-27 MED ORDER — SCOPOLAMINE 1 MG/3DAYS TD PT72
MEDICATED_PATCH | TRANSDERMAL | Status: AC
  Filled 2019-03-27: qty 1

## 2019-03-27 MED ORDER — ONDANSETRON HCL 4 MG/2ML IJ SOLN
INTRAMUSCULAR | Status: DC | PRN
  Administered 2019-03-27: 4 mg via INTRAVENOUS

## 2019-03-27 MED ORDER — ACETAMINOPHEN 10 MG/ML IV SOLN: 1000.0000 mg | Freq: Once | INTRAVENOUS | Status: DC | PRN

## 2019-03-27 MED ORDER — LIDOCAINE HCL URETHRAL/MUCOSAL 2 % EX GEL
CUTANEOUS | Status: AC
  Filled 2019-03-27: qty 5

## 2019-03-27 MED ORDER — FENTANYL CITRATE (PF) 100 MCG/2ML IJ SOLN
INTRAMUSCULAR | Status: AC
  Filled 2019-03-27: qty 2

## 2019-03-27 MED ORDER — PROPOFOL 10 MG/ML IV BOLUS
INTRAVENOUS | Status: AC
  Filled 2019-03-27: qty 40

## 2019-03-27 MED ORDER — MIDAZOLAM HCL 5 MG/5ML IJ SOLN
INTRAMUSCULAR | Status: DC | PRN
  Administered 2019-03-27: 2 mg via INTRAVENOUS

## 2019-03-27 MED ORDER — FENTANYL CITRATE (PF) 100 MCG/2ML IJ SOLN
INTRAMUSCULAR | Status: DC | PRN
  Administered 2019-03-27 (×2): 25 ug via INTRAVENOUS
  Administered 2019-03-27: 50 ug via INTRAVENOUS

## 2019-03-27 MED ORDER — OXYCODONE HCL 5 MG/5ML PO SOLN: 5.0000 mg | Freq: Once | ORAL | Status: DC | PRN

## 2019-03-27 MED ORDER — SODIUM CHLORIDE 0.9 % IR SOLN
Status: DC | PRN
  Administered 2019-03-27: 3000 mL

## 2019-03-27 SURGICAL SUPPLY — 22 items
BAG URO CATCHER STRL LF (MISCELLANEOUS) ×3 IMPLANT
BASKET ZERO TIP NITINOL 2.4FR (BASKET) IMPLANT
CATH URET 5FR 28IN OPEN ENDED (CATHETERS) ×3 IMPLANT
CATH URET DUAL LUMEN 6-10FR 50 (CATHETERS) ×2 IMPLANT
CLOTH BEACON ORANGE TIMEOUT ST (SAFETY) ×3 IMPLANT
COVER WAND RF STERILE (DRAPES) IMPLANT
EXTRACTOR STONE 1.7FRX115CM (UROLOGICAL SUPPLIES) ×2 IMPLANT
FIBER LASER TRAC TIP (UROLOGICAL SUPPLIES) ×2 IMPLANT
GLOVE BIOGEL M STRL SZ7.5 (GLOVE) ×3 IMPLANT
GOWN STRL REUS W/TWL XL LVL3 (GOWN DISPOSABLE) ×3 IMPLANT
GUIDEWIRE ANG ZIPWIRE 038X150 (WIRE) IMPLANT
GUIDEWIRE STR DUAL SENSOR (WIRE) ×3 IMPLANT
KIT TURNOVER KIT A (KITS) IMPLANT
MANIFOLD NEPTUNE II (INSTRUMENTS) ×3 IMPLANT
PACK CYSTO (CUSTOM PROCEDURE TRAY) ×3 IMPLANT
SHEATH URETERAL 12FRX28CM (UROLOGICAL SUPPLIES) IMPLANT
SHEATH URETERAL 12FRX35CM (MISCELLANEOUS) IMPLANT
STENT URET 6FRX24 CONTOUR (STENTS) ×2 IMPLANT
TUBING CONNECTING 10 (TUBING) ×2 IMPLANT
TUBING CONNECTING 10' (TUBING) ×1
TUBING UROLOGY SET (TUBING) ×3 IMPLANT
WIRE COONS/BENSON .038X145CM (WIRE) IMPLANT

## 2019-03-27 NOTE — Anesthesia Preprocedure Evaluation (Signed)
Anesthesia Evaluation  Patient identified by MRN, date of birth, ID band Patient awake    Reviewed: Allergy & Precautions, NPO status , Patient's Chart, lab work & pertinent test results  History of Anesthesia Complications (+) PONV and history of anesthetic complications  Airway Mallampati: III  TM Distance: >3 FB Neck ROM: Full    Dental  (+) Dental Advisory Given   Pulmonary neg pulmonary ROS,    breath sounds clear to auscultation       Cardiovascular hypertension,  Rhythm:Regular     Neuro/Psych PSYCHIATRIC DISORDERS Anxiety Depression negative neurological ROS     GI/Hepatic Neg liver ROS, GERD  Medicated and Controlled,  Endo/Other  diabetesMorbid obesity  Renal/GU Renal disease     Musculoskeletal negative musculoskeletal ROS (+)   Abdominal   Peds  Hematology negative hematology ROS (+)   Anesthesia Other Findings   Reproductive/Obstetrics                             Anesthesia Physical Anesthesia Plan  ASA: II  Anesthesia Plan: General   Post-op Pain Management:    Induction: Intravenous  PONV Risk Score and Plan: 4 or greater and Ondansetron, Dexamethasone, Propofol infusion and TIVA  Airway Management Planned: LMA  Additional Equipment: None  Intra-op Plan:   Post-operative Plan: Extubation in OR  Informed Consent: I have reviewed the patients History and Physical, chart, labs and discussed the procedure including the risks, benefits and alternatives for the proposed anesthesia with the patient or authorized representative who has indicated his/her understanding and acceptance.     Dental advisory given  Plan Discussed with: CRNA and Surgeon  Anesthesia Plan Comments:         Anesthesia Quick Evaluation

## 2019-03-27 NOTE — Op Note (Signed)
Preoperative diagnosis: left ureteral calculus  Postoperative diagnosis: left ureteral calculus  Procedure:  1. Cystoscopy 2. left ureteroscopy and stone removal 3. Ureteroscopic laser lithotripsy 4. left 21F x 24cm ureteral stent exchange  5. left, right retrograde pyelography with interpretation  Surgeon: Ardis Hughs, MD  Anesthesia: General  Complications: None  Intraoperative findings: left retrograde pyelography demonstrated a no filling defect within the left ureter and a very small defect within the lower pole of the left kidney consistent with the patient's known calculus without other abnormalities.  EBL: Minimal  Specimens: 1. left ureteral calculus  Disposition of specimens: [facility] for stone analysis  Indication: Amanda Anthony is a 63 y.o.   patient with a history of left stone and associated left symptoms.  She had a stent placed at an outside hospital.   After reviewing the management options for treatment, the patient elected to proceed with the above surgical procedure(s). We have discussed the potential benefits and risks of the procedure, side effects of the proposed treatment, the likelihood of the patient achieving the goals of the procedure, and any potential problems that might occur during the procedure or recuperation. Informed consent has been obtained.   Description of procedure:  The patient was taken to the operating room and general anesthesia was induced.  The patient was placed in the dorsal lithotomy position, prepped and draped in the usual sterile fashion, and preoperative antibiotics were administered. A preoperative time-out was performed.   Cystourethroscopy was performed.  The patient's urethra was examined and was normal. The bladder was then systematically examined in its entirety. There was no evidence for any bladder tumors, stones, or other mucosal pathology.    I then used a stent grasper and remove the  stent emanating from the patient's left ureteral orifice to the patient's urethral meatus.  I then advanced a sensor wire through the lumen of the stent and advanced the wire up into the left renal pelvis under fluoroscopic guidance.  I then remove the stent over the wire.  Next I exchange the stent for an open-ended ureteral catheter and performed a retrograde pyelogram with the above findings.   I then replaced the sensor wire and remove the open-ended catheter.  I then over this sensor wire advanced a dual-lumen catheter and passed a second wire up into the left renal pelvis.  I then advanced a dual-lumen flexible ureteroscope over the wire and into the renal pelvis removing the wire.  Pyeloscopy was then performed and the stone noted in the lower pole.  Using an engage basket the stone was moved to the upper pole. The stone was then fragmented with the 200 micron holmium laser fiber on a setting of 0.5 and frequency of 50 Hz.  A small piece of the stone was basketed and removed, but before this the stone was fragmented/dusted into numerous small pieces too small to basket. Reinspection of the ureter revealed no remaining visible stones or fragments.   The wire was then backloaded through the cystoscope and a ureteral stent was advance over the wire using Seldinger technique.  The stent was positioned appropriately under fluoroscopic and cystoscopic guidance.  The wire was then removed with an adequate stent curl noted in the renal pelvis as well as in the bladder.  The bladder was then emptied and the procedure ended.  The patient appeared to tolerate the procedure well and without complications.  The patient was able to be awakened and transferred to the recovery unit  in satisfactory condition.   Disposition: The tether of the stent was left on and tucked into the patient's vagina Instructions for removing the stent have been provided to the patient. The patient has been scheduled for followup in 6  weeks with a renal ultrasound.

## 2019-03-27 NOTE — H&P (Signed)
40 F seen as a work-in for eval and management of left ureteral stone.   She went to [facility] on 7/12 with acute renal colic. She was noted to have a 50mm stone in the proximal ureter. She was taken to the OR for ureteroscopy by Dr. Emi Holes. The stone migrated into the kidney and as such he opted not to chase the stone, but instead leave a stent and proceed with ESWL. She was then seen yesterday by Dr. Matilde Sprang for a second opinion. He didn't have any records or images. He rescheduled her to be seen today and requested her records, which I have reviewed.   The patient has done very well with the stent. She only notices it. She does have overactive bladder symptoms, this is baseline. She denies any hematuria. She has left mild abdominal flank pain. She denies any dysuria.     ALLERGIES: No Allergies    MEDICATIONS: Metformin Hcl  Coq-10  Desyrel  Pravastatin Sodium  Protonix  Singulair  Vitamin D2     GU PSH: Cystoscopy - 2018 Locm 300-399Mg /Ml Iodine,1Ml - 2018       PSH Notes: Tonsillectomy, Appendectomy   NON-GU PSH: Appendectomy - 2007 Remove Tonsils - 2007     GU PMH: Ureteral calculus - 03/20/2019 Microscopic hematuria - 2018 Nocturia - 2018 Urge incontinence - 2018 Urinary Frequency - 2018      PMH Notes:  1898-09-05 00:00:00 - Note: Normal Routine History And Physical Adult    NON-GU PMH: Diabetes Type 2 Hypercholesterolemia    FAMILY HISTORY: Acute Myocardial Infarction - Father Diabetes - Father Family Health Status Number - Runs In Family Urologic Disorder - Father, Sister   SOCIAL HISTORY: Marital Status: Married Preferred Language: English; Ethnicity: Not Hispanic Or Latino; Race: White Does not use smokeless tobacco. Has never drank.  Does not use drugs. Drinks 2 caffeinated drinks per day. Has not had a blood transfusion.     Notes: Occupation:, Tobacco Use, Marital History - Currently Married, Alcohol Use, Caffeine Use, Death In The  Family Mother, Death In The Family Father   REVIEW OF SYSTEMS:    GU Review Female:   Patient reports frequent urination, hard to postpone urination, and get up at night to urinate. Patient denies burning /pain with urination, leakage of urine, stream starts and stops, trouble starting your stream, have to strain to urinate, and being pregnant.  Gastrointestinal (Upper):   Patient denies nausea, vomiting, and indigestion/ heartburn.  Gastrointestinal (Lower):   Patient denies diarrhea and constipation.  Constitutional:   Patient denies fever, night sweats, weight loss, and fatigue.  Skin:   Patient denies skin rash/ lesion and itching.  Eyes:   Patient denies blurred vision and double vision.  Ears/ Nose/ Throat:   Patient denies sore throat and sinus problems.  Hematologic/Lymphatic:   Patient denies easy bruising and swollen glands.  Cardiovascular:   Patient denies leg swelling and chest pains.  Respiratory:   Patient denies cough and shortness of breath.  Endocrine:   Patient denies excessive thirst.  Musculoskeletal:   Patient reports back pain. Patient denies joint pain.  Neurological:   Patient denies headaches and dizziness.  Psychologic:   Patient denies depression and anxiety.   VITAL SIGNS:      03/21/2019 01:19 PM  Weight 220 lb / 99.79 kg  BP 151/89 mmHg  Pulse 69 /min  Temperature 98.5 F / 36.9 C   MULTI-SYSTEM PHYSICAL EXAMINATION:    Constitutional: Well-nourished. No physical deformities. Normally  developed. Good grooming.  Neck: Neck symmetrical, not swollen. Normal tracheal position.  Respiratory: Normal breath sounds. No labored breathing, no use of accessory muscles.   Cardiovascular: Regular rate and rhythm. No murmur, no gallop. Normal temperature, normal extremity pulses, no swelling, no varicosities.   Lymphatic: No enlargement of neck, axillae, groin.  Skin: No paleness, no jaundice, no cyanosis. No lesion, no ulcer, no rash.  Neurologic / Psychiatric:  Oriented to time, oriented to place, oriented to person. No depression, no anxiety, no agitation.  Gastrointestinal: No mass, no tenderness, no rigidity, non obese abdomen.  Eyes: Normal conjunctivae. Normal eyelids.  Ears, Nose, Mouth, and Throat: Left ear no scars, no lesions, no masses. Right ear no scars, no lesions, no masses. Nose no scars, no lesions, no masses. Normal hearing. Normal lips.  Musculoskeletal: Normal gait and station of head and neck.     PAST DATA REVIEWED:  Source Of History:  Patient  Records Review:   Previous Patient Records  X-Ray Review: KUB: Reviewed Films. Discussed With Patient.  C.T. Abdomen/Pelvis: Reviewed Films. Discussed With Patient.     PROCEDURES:         KUB - [id number]  A single view of the abdomen is obtained. Renal shadows are easily visualized bilaterally. There are no stones appreciated within the expected location in either renal pelvis. On the patient's x-ray today there is a stent that is in the appropriate position in the left ureter. There are no additional calcifications along the expected location of either ureter bilaterally.  Gas pattern is grossly normal. No significant bony abnormalities.       Impression: The stent is in the appropriate position of the left ureter, there is no stone noted along the stent or even in the left renal pelvis. Patient confirmed No Neulasta OnPro Device.           Urinalysis w/Scope Dipstick Dipstick Cont'd Micro  Color: Yellow Bilirubin: Neg mg/dL WBC/hpf: 6 - 10/hpf  Appearance: Cloudy Ketones: Neg mg/dL RBC/hpf: 20 - 40/hpf  Specific Gravity: 1.025 Blood: 3+ ery/uL Bacteria: Few (10-25/hpf)  pH: 6.0 Protein: 2+ mg/dL Cystals: NS (Not Seen)  Glucose: Neg mg/dL Urobilinogen: 0.2 mg/dL Casts: NS (Not Seen)    Nitrites: Neg Trichomonas: Not Present    Leukocyte Esterase: 2+ leu/uL Mucous: Present      Epithelial Cells: 0 - 5/hpf      Yeast: NS (Not Seen)      Sperm: Not Present    ASSESSMENT:       ICD-10 Details  1 GU:   Ureteral calculus - N20.1   2   Urge incontinence - N39.41    PLAN:           Orders Labs Urine Culture          Schedule Return Visit/Planned Activity: ASAP - Schedule Surgery          Document Letter(s):  Created for Patient: Clinical Summary         Notes:   Unfortunately, the patient's stone is not visualized on today's KUB. As such, I do not think she is a good candidate for shockwave lithotripsy. We did instead talk about ureteroscopy. Will try to expedite this in get this taken care for her quickly.   I went over the treatment options for their stone. We discussed ongoing medical expulsion therapy, ESWL and ureteroscopy. Ultimately the patient and I agreed that ureterscopy is the best option. I went over this surgery with the patient in detail.  The patient understands after being put to sleep, we would proceed with a telescope to access the stone and potentially use a laser to fragment the stone before removing it with a basket. After removing the stone the patient will require temporary stent placement in the ureter. This is an outpatient procedure. I also discussed the potential of not being able to gain access safely into the ureter/kidney. This would require that a stent be placed and then the patient rescheduled several weeks later for a second attempt. They also understand the small risks of ureteral trauma causing a stricture or permanent damage. I also explained the risk of urinary tract infection. Having gone over the procedure itself, the expected outcome, and the risks/benefits the patient has agreed to proceed.

## 2019-03-27 NOTE — Anesthesia Postprocedure Evaluation (Signed)
Anesthesia Post Note  Patient: Amanda Anthony  Procedure(s) Performed: CYSTOSCOPY LEFT URETEROSCOPY/HOLMIUM LASER/STENT EXCHANGE (Left Bladder)     Patient location during evaluation: PACU Anesthesia Type: General Level of consciousness: awake and alert Pain management: pain level controlled Vital Signs Assessment: post-procedure vital signs reviewed and stable Respiratory status: spontaneous breathing, nonlabored ventilation, respiratory function stable and patient connected to nasal cannula oxygen Cardiovascular status: blood pressure returned to baseline and stable Postop Assessment: no apparent nausea or vomiting Anesthetic complications: no    Last Vitals:  Vitals:   03/27/19 0930 03/27/19 1007  BP: 122/84 134/74  Pulse:  71  Resp: 14 18  Temp: 36.7 C 36.7 C  SpO2:  100%    Last Pain:  Vitals:   03/27/19 1007  TempSrc: Axillary  PainSc: 0-No pain                 Fujie Dickison

## 2019-03-27 NOTE — Interval H&P Note (Signed)
History and Physical Interval Note:  03/27/2019 7:01 AM  Amanda Anthony  has presented today for surgery, with the diagnosis of LEFT KIDNEY STONE.  The various methods of treatment have been discussed with the patient and family. After consideration of risks, benefits and other options for treatment, the patient has consented to  Procedure(s): CYSTOSCOPY LEFT URETEROSCOPY/HOLMIUM LASER/STENT EXCHANGE (Left) as a surgical intervention.  The patient's history has been reviewed, patient examined, no change in status, stable for surgery.  I have reviewed the patient's chart and labs.  Questions were answered to the patient's satisfaction.     Ardis Hughs

## 2019-03-27 NOTE — Transfer of Care (Signed)
Immediate Anesthesia Transfer of Care Note  Patient: Amanda Anthony  Procedure(s) Performed: Procedure(s): CYSTOSCOPY LEFT URETEROSCOPY/HOLMIUM LASER/STENT EXCHANGE (Left)  Patient Location: PACU  Anesthesia Type:TIVA  Level of Consciousness:  sedated, patient cooperative and responds to stimulation  Airway & Oxygen Therapy:Patient Spontanous Breathing and Patient connected to face mask oxgen  Post-op Assessment:  Report given to PACU RN and Post -op Vital signs reviewed and stable  Post vital signs:  Reviewed and stable  Last Vitals:  Vitals:   03/27/19 0546  BP: (!) 159/95  Pulse: 83  Resp: 18  Temp: 36.9 C  SpO2: 388%    Complications: No apparent anesthesia complications

## 2019-03-27 NOTE — Discharge Instructions (Signed)
DISCHARGE INSTRUCTIONS FOR KIDNEY STONE/URETERAL STENT   MEDICATIONS:  1.  Resume all your other meds from home - except do not take any extra narcotic pain meds that you may have at home.  2. Pyridium is to help with the burning/stinging when you urinate. 3. Tramadol is for moderate/severe pain, otherwise taking upto 1000 mg every 6 hours of plainTylenol will help treat your pain.   4. Take Cipro one hour prior to removal of your stent.   ACTIVITY:  1. No strenuous activity x 1week  2. No driving while on narcotic pain medications  3. Drink plenty of water  4. Continue to walk at home - you can still get blood clots when you are at home, so keep active, but don't over do it.  5. May return to work/school tomorrow or when you feel ready   BATHING:  1. You can shower and we recommend daily showers  2. You have a string coming from your urethra: The stent string is attached to your ureteral stent. Do not pull on this.   SIGNS/SYMPTOMS TO CALL:  Please call us if you have a fever greater than 101.5, uncontrolled nausea/vomiting, uncontrolled pain, dizziness, unable to urinate, bloody urine, chest pain, shortness of breath, leg swelling, leg pain, redness around wound, drainage from wound, or any other concerns or questions.   You can reach [place] at [phone].   FOLLOW-UP:  1. You have an appointment in 6 weeks with a ultrasound of your kidneys prior.   2. You have a string attached to your stent, you may remove it on Monday, July 27th. To do this, pull the strings until the stents are completely removed. You may feel an odd sensation in your back.

## 2019-03-27 NOTE — Anesthesia Procedure Notes (Signed)
Procedure Name: Intubation Date/Time: 03/27/2019 7:38 AM Performed by: Anne Fu, CRNA Pre-anesthesia Checklist: Patient identified, Emergency Drugs available, Suction available, Patient being monitored and Timeout performed Patient Re-evaluated:Patient Re-evaluated prior to induction Oxygen Delivery Method: Circle system utilized Preoxygenation: Pre-oxygenation with 100% oxygen Induction Type: IV induction Ventilation: Mask ventilation without difficulty Laryngoscope Size: Mac and 4 Tube type: Oral Tube size: 7.5 mm Number of attempts: 1 Airway Equipment and Method: Stylet Placement Confirmation: ETT inserted through vocal cords under direct vision,  positive ETCO2 and breath sounds checked- equal and bilateral Tube secured with: Tape Dental Injury: Teeth and Oropharynx as per pre-operative assessment

## 2019-03-28 ENCOUNTER — Encounter (HOSPITAL_COMMUNITY): Payer: Self-pay | Admitting: Urology

## 2019-04-09 ENCOUNTER — Other Ambulatory Visit: Payer: Self-pay | Admitting: Obstetrics

## 2019-04-09 DIAGNOSIS — Z1231 Encounter for screening mammogram for malignant neoplasm of breast: Secondary | ICD-10-CM

## 2019-04-17 ENCOUNTER — Telehealth: Payer: Self-pay | Admitting: Gastroenterology

## 2019-04-17 NOTE — Telephone Encounter (Signed)
Pt is scheduled to have gallbladder removed and would like to know whether she needs an appt with Dr. Fuller Plan.

## 2019-04-17 NOTE — Telephone Encounter (Signed)
Patient is advised that she can have her PCP refer her to a surgeon is he feels she needs gallbladder removed.  I offered to set up an office visit if she would like to discuss with Dr. Fuller Plan but would not be able to make a referral for her until she is seen.  She will ask PCP to make appt.

## 2019-05-24 ENCOUNTER — Other Ambulatory Visit: Payer: Self-pay

## 2019-05-24 ENCOUNTER — Ambulatory Visit
Admission: RE | Admit: 2019-05-24 | Discharge: 2019-05-24 | Disposition: A | Discharge status: Home / Self Care | Payer: BC Managed Care – PPO | Source: Ambulatory Visit | Attending: Obstetrics | Admitting: Obstetrics

## 2019-05-24 DIAGNOSIS — Z1231 Encounter for screening mammogram for malignant neoplasm of breast: Secondary | ICD-10-CM

## 2020-05-15 ENCOUNTER — Encounter: Payer: Self-pay | Admitting: Gastroenterology

## 2020-06-04 ENCOUNTER — Ambulatory Visit (AMBULATORY_SURGERY_CENTER): Payer: Self-pay | Admitting: *Deleted

## 2020-06-04 ENCOUNTER — Other Ambulatory Visit: Payer: Self-pay

## 2020-06-04 VITALS — Ht 65.0 in | Wt 211.0 lb

## 2020-06-04 DIAGNOSIS — Z86010 Personal history of colonic polyps: Secondary | ICD-10-CM

## 2020-06-04 DIAGNOSIS — Z800 Family history of malignant neoplasm of digestive organs: Secondary | ICD-10-CM

## 2020-06-04 MED ORDER — PLENVU 140 G PO SOLR: 1.0000 | ORAL | 0 refills | Status: DC

## 2020-06-04 NOTE — Progress Notes (Signed)
No egg or soy allergy known to patient  issues with past sedation with any surgeries or procedures of PONV - no nausea with MAC at 2016 Colon per pt  no intubation problems in the past  No FH of Malignant Hyperthermia No diet pills per patient No home 02 use per patient  No blood thinners per patient  Pt denies issues with constipation  No A fib or A flutter  EMMI video to pt or via [place] 19 guidelines implemented in PV today with Pt and RN   Plenvu Coupon given to pt in PV today , Code to Pharmacy   Due to the COVID-19 pandemic we are asking patients to follow these guidelines. Please only bring one care partner. Please be aware that your care partner may wait in the car in the parking lot or if they feel like they will be too hot to wait in the car, they may wait in the lobby on the 4th floor. All care partners are required to wear a mask the entire time (we do not have any that we can provide them), they need to practice social distancing, and we will do a Covid check for all patient's and care partners when you arrive. Also we will check their temperature and your temperature. If the care partner waits in their car they need to stay in the parking lot the entire time and we will call them on their cell phone when the patient is ready for discharge so they can bring the car to the front of the building. Also all patient's will need to wear a mask into building.

## 2020-06-05 ENCOUNTER — Encounter: Payer: Self-pay | Admitting: Gastroenterology

## 2020-06-08 ENCOUNTER — Other Ambulatory Visit: Payer: Self-pay | Admitting: Obstetrics

## 2020-06-08 DIAGNOSIS — Z1231 Encounter for screening mammogram for malignant neoplasm of breast: Secondary | ICD-10-CM

## 2020-06-10 ENCOUNTER — Encounter (HOSPITAL_COMMUNITY): Payer: Self-pay | Admitting: Urology

## 2020-06-10 ENCOUNTER — Other Ambulatory Visit: Payer: Self-pay | Admitting: Urology

## 2020-06-10 ENCOUNTER — Other Ambulatory Visit: Payer: Self-pay

## 2020-06-10 NOTE — Progress Notes (Signed)
COVID Vaccine Completed: Yes  Date COVID Vaccine completed: 12/2019 COVID vaccine manufacturer:Moderna     PCP -  Dr. Donnetta Hutching [place] Cardiologist - Dr. Rinaldo Cloud  Chest x-ray -greater than 1 year EKG - greater than 1 year Stress Test - greater than 2 years ECHO - N/A Cardiac Cath - N/A Pacemaker/ICD device last checked: N/A  Sleep Study - N/A CPAP - N/A   Fasting Blood Sugar - 100 Checks Blood Sugar ___1__ times a day  Blood Thinner Instructions: N/A Aspirin Instructions: N/A Last Dose: N/A  Anesthesia review: N/A  Patient denies shortness of breath, fever, cough and chest pain at PAT appointment   Patient verbalized understanding of instructions that were given to them at the PAT appointment. Patient was also instructed that they will need to review over the PAT instructions again at home before surgery.

## 2020-06-11 ENCOUNTER — Ambulatory Visit (HOSPITAL_COMMUNITY)
Admission: RE | Admit: 2020-06-11 | Discharge: 2020-06-11 | Disposition: A | Discharge status: Home / Self Care | Payer: BC Managed Care – PPO | Attending: Urology | Admitting: Urology

## 2020-06-11 ENCOUNTER — Encounter (HOSPITAL_COMMUNITY)
Admission: RE | Disposition: A | Discharge status: Home / Self Care | Payer: Self-pay | Source: Home / Self Care | Attending: Urology

## 2020-06-11 ENCOUNTER — Ambulatory Visit (HOSPITAL_COMMUNITY): Payer: BC Managed Care – PPO | Admitting: Anesthesiology

## 2020-06-11 ENCOUNTER — Ambulatory Visit (HOSPITAL_COMMUNITY): Payer: BC Managed Care – PPO

## 2020-06-11 ENCOUNTER — Encounter (HOSPITAL_COMMUNITY): Payer: Self-pay | Admitting: Urology

## 2020-06-11 DIAGNOSIS — N201 Calculus of ureter: Secondary | ICD-10-CM | POA: Diagnosis not present

## 2020-06-11 DIAGNOSIS — E119 Type 2 diabetes mellitus without complications: Secondary | ICD-10-CM | POA: Insufficient documentation

## 2020-06-11 DIAGNOSIS — K219 Gastro-esophageal reflux disease without esophagitis: Secondary | ICD-10-CM | POA: Insufficient documentation

## 2020-06-11 DIAGNOSIS — Z79899 Other long term (current) drug therapy: Secondary | ICD-10-CM | POA: Diagnosis not present

## 2020-06-11 DIAGNOSIS — Z20822 Contact with and (suspected) exposure to covid-19: Secondary | ICD-10-CM | POA: Insufficient documentation

## 2020-06-11 DIAGNOSIS — Z7984 Long term (current) use of oral hypoglycemic drugs: Secondary | ICD-10-CM | POA: Diagnosis not present

## 2020-06-11 HISTORY — PX: HOLMIUM LASER APPLICATION: SHX5852

## 2020-06-11 HISTORY — PX: CYSTOSCOPY WITH RETROGRADE PYELOGRAM, URETEROSCOPY AND STENT PLACEMENT: SHX5789

## 2020-06-11 LAB — BASIC METABOLIC PANEL
Anion gap: 12 (ref 5–15)
BUN: 18 mg/dL (ref 8–23)
CO2: 25 mmol/L (ref 22–32)
Calcium: 9.5 mg/dL (ref 8.9–10.3)
Chloride: 107 mmol/L (ref 98–111)
Creatinine, Ser: 0.84 mg/dL (ref 0.44–1.00)
GFR calc non Af Amer: >60 mL/min (ref >60)
Glucose, Bld: 137 mg/dL — ABNORMAL HIGH (ref 70–99)
Potassium: 3.9 mmol/L (ref 3.5–5.1)
Sodium: 144 mmol/L (ref 135–145)

## 2020-06-11 LAB — GLUCOSE, CAPILLARY
Glucose-Capillary: 131 mg/dL — ABNORMAL HIGH (ref 70–99)
Glucose-Capillary: 129 mg/dL — ABNORMAL HIGH (ref 70–99)

## 2020-06-11 LAB — RESPIRATORY PANEL BY RT PCR (FLU A&B, COVID)
Influenza A by PCR: NEGATIVE
Influenza B by PCR: NEGATIVE
SARS Coronavirus 2 by RT PCR: NEGATIVE

## 2020-06-11 LAB — HEMOGLOBIN A1C
Hgb A1c MFr Bld: 6.8 % — ABNORMAL HIGH (ref 4.8–5.6)
Mean Plasma Glucose: 148.46 mg/dL

## 2020-06-11 LAB — CBC
HCT: 37.4 % (ref 36.0–46.0)
Hemoglobin: 11.7 g/dL — ABNORMAL LOW (ref 12.0–15.0)
MCH: 27.3 pg (ref 26.0–34.0)
MCHC: 31.3 g/dL (ref 30.0–36.0)
MCV: 87.2 fL (ref 80.0–100.0)
Platelets: 261 10*3/uL (ref 150–400)
RBC: 4.29 MIL/uL (ref 3.87–5.11)
RDW: 14.5 % (ref 11.5–15.5)
WBC: 5.8 10*3/uL (ref 4.0–10.5)
nRBC: 0 % (ref 0.0–0.2)

## 2020-06-11 SURGERY — CYSTOURETEROSCOPY, WITH RETROGRADE PYELOGRAM AND STENT INSERTION
Anesthesia: General | Laterality: Left

## 2020-06-11 MED ORDER — PROMETHAZINE HCL 12.5 MG PO TABS: 12.5000 mg | ORAL_TABLET | Freq: Four times a day (QID) | ORAL | 0 refills | Status: DC | PRN

## 2020-06-11 MED ORDER — CEFAZOLIN SODIUM-DEXTROSE 2-4 GM/100ML-% IV SOLN
INTRAVENOUS | Status: AC
  Filled 2020-06-11: qty 100

## 2020-06-11 MED ORDER — IOHEXOL 300 MG/ML  SOLN
INTRAMUSCULAR | Status: DC | PRN
  Administered 2020-06-11: 20 mL

## 2020-06-11 MED ORDER — ORAL CARE MOUTH RINSE: 15.0000 mL | Freq: Once | OROMUCOSAL | Status: AC

## 2020-06-11 MED ORDER — KETOROLAC TROMETHAMINE 30 MG/ML IJ SOLN
INTRAMUSCULAR | Status: DC | PRN
  Administered 2020-06-11: 30 mg via INTRAVENOUS

## 2020-06-11 MED ORDER — OXYCODONE HCL 5 MG/5ML PO SOLN: 5.0000 mg | Freq: Once | ORAL | Status: DC | PRN

## 2020-06-11 MED ORDER — KETOROLAC TROMETHAMINE 30 MG/ML IJ SOLN
INTRAMUSCULAR | Status: AC
  Filled 2020-06-11: qty 1

## 2020-06-11 MED ORDER — ONDANSETRON HCL 4 MG/2ML IJ SOLN
INTRAMUSCULAR | Status: AC
  Filled 2020-06-11: qty 2

## 2020-06-11 MED ORDER — SODIUM CHLORIDE 0.9 % IR SOLN
Status: DC | PRN
  Administered 2020-06-11: 3000 mL

## 2020-06-11 MED ORDER — PHENAZOPYRIDINE HCL 200 MG PO TABS: 200.0000 mg | ORAL_TABLET | Freq: Three times a day (TID) | ORAL | 0 refills | Status: DC | PRN

## 2020-06-11 MED ORDER — PROPOFOL 10 MG/ML IV BOLUS
INTRAVENOUS | Status: AC
  Filled 2020-06-11: qty 20

## 2020-06-11 MED ORDER — DEXAMETHASONE SODIUM PHOSPHATE 10 MG/ML IJ SOLN
INTRAMUSCULAR | Status: DC | PRN
  Administered 2020-06-11: 10 mg via INTRAVENOUS

## 2020-06-11 MED ORDER — CHLORHEXIDINE GLUCONATE 0.12 % MT SOLN
15.0000 mL | Freq: Once | OROMUCOSAL | Status: AC
  Administered 2020-06-11: 15 mL via OROMUCOSAL

## 2020-06-11 MED ORDER — DEXAMETHASONE SODIUM PHOSPHATE 10 MG/ML IJ SOLN
INTRAMUSCULAR | Status: AC
  Filled 2020-06-11: qty 1

## 2020-06-11 MED ORDER — CIPROFLOXACIN HCL 500 MG PO TABS: 500.0000 mg | ORAL_TABLET | Freq: Once | ORAL | 0 refills | Status: AC

## 2020-06-11 MED ORDER — LACTATED RINGERS IV SOLN: INTRAVENOUS | Status: DC

## 2020-06-11 MED ORDER — OXYCODONE HCL 5 MG PO TABS: 5.0000 mg | ORAL_TABLET | Freq: Once | ORAL | Status: DC | PRN

## 2020-06-11 MED ORDER — LIDOCAINE 2% (20 MG/ML) 5 ML SYRINGE
INTRAMUSCULAR | Status: DC | PRN
  Administered 2020-06-11: 100 mg via INTRAVENOUS

## 2020-06-11 MED ORDER — LIDOCAINE 2% (20 MG/ML) 5 ML SYRINGE
INTRAMUSCULAR | Status: AC
  Filled 2020-06-11: qty 5

## 2020-06-11 MED ORDER — FENTANYL CITRATE (PF) 100 MCG/2ML IJ SOLN
INTRAMUSCULAR | Status: AC
  Filled 2020-06-11: qty 2

## 2020-06-11 MED ORDER — FENTANYL CITRATE (PF) 100 MCG/2ML IJ SOLN: 25.0000 ug | INTRAMUSCULAR | Status: DC | PRN

## 2020-06-11 MED ORDER — MIDAZOLAM HCL 5 MG/5ML IJ SOLN
INTRAMUSCULAR | Status: DC | PRN
  Administered 2020-06-11: 2 mg via INTRAVENOUS

## 2020-06-11 MED ORDER — FENTANYL CITRATE (PF) 100 MCG/2ML IJ SOLN
INTRAMUSCULAR | Status: DC | PRN
  Administered 2020-06-11: 25 ug via INTRAVENOUS

## 2020-06-11 MED ORDER — CEFAZOLIN SODIUM-DEXTROSE 2-4 GM/100ML-% IV SOLN
2.0000 g | INTRAVENOUS | Status: AC
  Administered 2020-06-11: 2 g via INTRAVENOUS

## 2020-06-11 MED ORDER — CEFAZOLIN SODIUM-DEXTROSE 2-3 GM-%(50ML) IV SOLR: INTRAVENOUS | Status: DC | PRN

## 2020-06-11 MED ORDER — MIDAZOLAM HCL 2 MG/2ML IJ SOLN
INTRAMUSCULAR | Status: AC
  Filled 2020-06-11: qty 2

## 2020-06-11 MED ORDER — PROPOFOL 10 MG/ML IV BOLUS
INTRAVENOUS | Status: DC | PRN
  Administered 2020-06-11: 180 mg via INTRAVENOUS

## 2020-06-11 MED ORDER — PROMETHAZINE HCL 25 MG/ML IJ SOLN: 6.2500 mg | INTRAMUSCULAR | Status: DC | PRN

## 2020-06-11 MED ORDER — ONDANSETRON HCL 4 MG/2ML IJ SOLN
INTRAMUSCULAR | Status: DC | PRN
  Administered 2020-06-11: 4 mg via INTRAVENOUS

## 2020-06-11 SURGICAL SUPPLY — 28 items
BAG URO CATCHER STRL LF (MISCELLANEOUS) ×3 IMPLANT
BASKET ZERO TIP NITINOL 2.4FR (BASKET) IMPLANT
BSKT STON RTRVL ZERO TP 2.4FR (BASKET)
CATH URET 5FR 28IN OPEN ENDED (CATHETERS) ×3 IMPLANT
CLOTH BEACON ORANGE TIMEOUT ST (SAFETY) ×3 IMPLANT
COVER SURGICAL LIGHT HANDLE (MISCELLANEOUS) ×3 IMPLANT
COVER WAND RF STERILE (DRAPES) IMPLANT
EXTRACTOR STONE 1.7FRX115CM (UROLOGICAL SUPPLIES) ×3 IMPLANT
FIBER LASER MOSES 365 DFL (Laser) ×3 IMPLANT
GLOVE BIOGEL M STRL SZ7.5 (GLOVE) ×3 IMPLANT
GOWN STRL REUS W/TWL XL LVL3 (GOWN DISPOSABLE) ×3 IMPLANT
GUIDEWIRE ANG ZIPWIRE 038X150 (WIRE) IMPLANT
GUIDEWIRE STR DUAL SENSOR (WIRE) ×3 IMPLANT
KIT TURNOVER KIT A (KITS) ×3 IMPLANT
LASER FIB FLEXIVA PULSE ID 365 (Laser) IMPLANT
LASER FIB FLEXIVA PULSE ID 550 (Laser) IMPLANT
LASER FIB FLEXIVA PULSE ID 910 (Laser) IMPLANT
MANIFOLD NEPTUNE II (INSTRUMENTS) ×3 IMPLANT
PACK CYSTO (CUSTOM PROCEDURE TRAY) ×3 IMPLANT
PENCIL SMOKE EVACUATOR (MISCELLANEOUS) IMPLANT
SHEATH URETERAL 12FRX28CM (UROLOGICAL SUPPLIES) IMPLANT
SHEATH URETERAL 12FRX35CM (MISCELLANEOUS) ×3 IMPLANT
STENT URET 6FRX24 CONTOUR (STENTS) ×3 IMPLANT
TRACTIP FLEXIVA PULS ID 200XHI (Laser) IMPLANT
TRACTIP FLEXIVA PULSE ID 200 (Laser)
TUBING CONNECTING 10 (TUBING) ×2 IMPLANT
TUBING CONNECTING 10' (TUBING) ×1
TUBING UROLOGY SET (TUBING) ×3 IMPLANT

## 2020-06-11 NOTE — Anesthesia Postprocedure Evaluation (Signed)
Anesthesia Post Note  Patient: ZIAIRE BIESER  Procedure(s) Performed: CYSTOSCOPY WITH RETROGRADE PYELOGRAM, URETEROSCOPY AND STENT PLACEMENT (Left ) HOLMIUM LASER APPLICATION (Left )     Patient location during evaluation: PACU Anesthesia Type: General Level of consciousness: sedated Pain management: pain level controlled Vital Signs Assessment: post-procedure vital signs reviewed and stable Respiratory status: spontaneous breathing and respiratory function stable Cardiovascular status: stable Postop Assessment: no apparent nausea or vomiting Anesthetic complications: no   No complications documented.  Last Vitals:  Vitals:   06/11/20 1315 06/11/20 1318  BP:  (!) 150/80  Pulse: 64 64  Resp:  15  Temp:  36.5 C  SpO2: 98% 98%    Last Pain:  Vitals:   06/11/20 1318  TempSrc:   PainSc: 0-No pain                 Merlinda Frederick

## 2020-06-11 NOTE — Anesthesia Procedure Notes (Signed)
Procedure Name: LMA Insertion Date/Time: 06/11/2020 11:04 AM Performed by: Sharlette Dense, CRNA Patient Re-evaluated:Patient Re-evaluated prior to induction Oxygen Delivery Method: Circle system utilized Preoxygenation: Pre-oxygenation with 100% oxygen Induction Type: IV induction Ventilation: Mask ventilation without difficulty LMA: LMA inserted LMA Size: 4.0 Number of attempts: 1 Placement Confirmation: positive ETCO2 and breath sounds checked- equal and bilateral Tube secured with: Tape Dental Injury: Teeth and Oropharynx as per pre-operative assessment

## 2020-06-11 NOTE — Interval H&P Note (Signed)
History and Physical Interval Note:  06/11/2020 10:59 AM  Amanda Anthony  has presented today for surgery, with the diagnosis of LEFT OBSTRUCTING CALCULUS.  The various methods of treatment have been discussed with the patient and family. After consideration of risks, benefits and other options for treatment, the patient has consented to  Procedure(s) with comments: CYSTOSCOPY WITH RETROGRADE PYELOGRAM, URETEROSCOPY AND STENT PLACEMENT (Left) - 34 MINS HOLMIUM LASER APPLICATION (Left) as a surgical intervention.  The patient's history has been reviewed, patient examined, no change in status, stable for surgery.  I have reviewed the patient's chart and labs.  Questions were answered to the patient's satisfaction.     Ardis Hughs

## 2020-06-11 NOTE — Discharge Instructions (Signed)
DISCHARGE INSTRUCTIONS FOR KIDNEY STONE/URETERAL STENT   MEDICATIONS:  1. Resume all your other meds from home - except do not take any extra narcotic pain meds that you may have at home.  2. Pyridium is to help with the burning/stinging when you urinate. 3. Tamsulosin is for ureteral spasm. 4. Take the pain medication you were given from clinic. 5. Phenergan is for nausea.  6. Take Cipro one hour prior to removal of your stent.   ACTIVITY:  1. No strenuous activity x 1week  2. No driving while on narcotic pain medications  3. Drink plenty of water  4. Continue to walk at home - you can still get blood clots when you are at home, so keep active, but don't over do it.  5. May return to work/school tomorrow or when you feel ready   BATHING:  1. You can shower and we recommend daily showers  2. You have a string coming from your urethra: The stent string is attached to your ureteral stent. Do not pull on this.   SIGNS/SYMPTOMS TO CALL:  Please call us if you have a fever greater than 101.5, uncontrolled nausea/vomiting, uncontrolled pain, dizziness, unable to urinate, bloody urine, chest pain, shortness of breath, leg swelling, leg pain, redness around wound, drainage from wound, or any other concerns or questions.   You can reach [place] at [phone].   FOLLOW-UP:  1. You have an appointment in 6 weeks with a ultrasound of your kidneys prior.   2. You have a string attached to your stent, you may remove it on Tuesday 10/12. To do this, pull the strings until the stents are completely removed. You may feel an odd sensation in your back.

## 2020-06-11 NOTE — Transfer of Care (Signed)
Immediate Anesthesia Transfer of Care Note  Patient: Amanda Anthony  Procedure(s) Performed: CYSTOSCOPY WITH RETROGRADE PYELOGRAM, URETEROSCOPY AND STENT PLACEMENT (Left ) HOLMIUM LASER APPLICATION (Left )  Patient Location: PACU  Anesthesia Type:General  Level of Consciousness: awake and alert   Airway & Oxygen Therapy: Patient Spontanous Breathing and Patient connected to face mask oxygen  Post-op Assessment: Report given to RN and Post -op Vital signs reviewed and stable  Post vital signs: Reviewed and stable  Last Vitals:  Vitals Value Taken Time  BP 169/96 06/11/20 1222  Temp    Pulse 72 06/11/20 1223  Resp 16 06/11/20 1223  SpO2 100 % 06/11/20 1223  Vitals shown include unvalidated device data.  Last Pain:  Vitals:   06/11/20 0829  TempSrc:   PainSc: 5       Patients Stated Pain Goal: 4 (48/47/20 7218)  Complications: No complications documented.

## 2020-06-11 NOTE — H&P (Signed)
cc: Left flank pain   HPI: 64 year old female with a past medical history of urolithiasis requiring ureteroscopy in the past. She has done 24 hour urines and has been adhering to her diet as well as had increased water intake of 2-3 L daily. Today, she reports left-sided flank discomfort that began 1 month ago, this is associated with incontinence, frequency of urination, and suprapubic pressure. She denies fevers, but does endorses some intermittent chills. She has not seen any stones pass. She had a renal US and x ray completed by her PCP. She was told she had gall stones, however, her renal US did show left sided hydronephrosis.     ALLERGIES: No Allergies    MEDICATIONS: Metformin Hcl  Coq-10  Desyrel  Pravastatin Sodium  Protonix  Singulair  Vitamin D2     GU PSH: Cystoscopy - 2018 Locm 300-399Mg /Ml Iodine,1Ml - 2018 Ureteroscopic laser litho, Left - 03/27/2019       PSH Notes: Tonsillectomy, Appendectomy   NON-GU PSH: Appendectomy - 2007 Remove Tonsils - 2007     GU PMH: History of urolithiasis - 06/03/2019 Acute Cystitis/UTI - 05/08/2019 Ureteral calculus - 03/20/2019 Microscopic hematuria - 2018 Nocturia - 2018 Urge incontinence - 2018 Urinary Frequency - 2018      PMH Notes:  1898-09-05 00:00:00 - Note: Normal Routine History And Physical Adult   NON-GU PMH: Diabetes Type 2 Hypercholesterolemia    FAMILY HISTORY: Acute Myocardial Infarction - Father Diabetes - Father Family Health Status Number - Runs In Family Urologic Disorder - Father, Sister   SOCIAL HISTORY: Marital Status: Married Preferred Language: English; Ethnicity: Not Hispanic Or Latino; Race: White Does not use smokeless tobacco. Has never drank.  Does not use drugs. Drinks 2 caffeinated drinks per day. Has not had a blood transfusion.     Notes: Occupation:, Tobacco Use, Marital History - Currently Married, Alcohol Use, Caffeine Use, Death In The Family Mother, Death In The Family Father    REVIEW OF SYSTEMS:    GU Review Female:   Patient reports frequent urination, hard to postpone urination, and get up at night to urinate. Patient denies burning /pain with urination, leakage of urine, stream starts and stops, trouble starting your stream, have to strain to urinate, and being pregnant.  Gastrointestinal (Upper):   Patient reports nausea. Patient denies vomiting and indigestion/ heartburn.  Gastrointestinal (Lower):   Patient denies diarrhea and constipation.  Constitutional:   Patient denies fever, night sweats, weight loss, and fatigue.  Skin:   Patient denies skin rash/ lesion and itching.  Eyes:   Patient denies blurred vision and double vision.  Ears/ Nose/ Throat:   Patient denies sore throat and sinus problems.  Hematologic/Lymphatic:   Patient denies swollen glands and easy bruising.  Cardiovascular:   Patient denies leg swelling and chest pains.  Respiratory:   Patient denies cough and shortness of breath.  Endocrine:   Patient denies excessive thirst.  Musculoskeletal:   Patient reports back pain. Patient denies joint pain.  Neurological:   Patient denies headaches and dizziness.  Psychologic:   Patient denies depression and anxiety.   Notes: Lower left side back pain.    VITAL SIGNS:      06/10/2020 08:51 AM  Weight 210 lb / 95.25 kg  Height 66 in / 167.64 cm  BP 154/94 mmHg  Heart Rate 82 /min  Temperature 97.3 F / 36.2 C  BMI 33.9 kg/m   GU PHYSICAL EXAMINATION:      Notes: Mild left-sided CVA  tenderness   MULTI-SYSTEM PHYSICAL EXAMINATION:    Constitutional: Well-nourished. No physical deformities. Normally developed. Good grooming.  Neck: Neck symmetrical, not swollen. Normal tracheal position.  Respiratory: No labored breathing, no use of accessory muscles.   Cardiovascular: Normal temperature, normal extremity pulses, no swelling, no varicosities.  Skin: No paleness, no jaundice, no cyanosis. No lesion, no ulcer, no rash.  Neurologic /  Psychiatric: Oriented to time, oriented to place, oriented to person. No depression, no anxiety, no agitation.  Musculoskeletal: Normal gait and station of head and neck.     Complexity of Data:  Source Of History:  Patient, Medical Record Summary  Lab Test Review:   Stone Analysis  Records Review:   Previous Doctor Records, Previous Patient Records  Urine Test Review:   Urinalysis, Urine Culture, 24 Hour Urine  X-Ray Review: Outside Ultrasound: Reviewed Films. Reviewed Report. Discussed With Patient.  Outside X-Ray: Reviewed Films. Reviewed Report. Discussed With Patient.  C.T. Abdomen/Pelvis: Reviewed Films. Discussed With Patient.     06/10/20  Urinalysis  Urine Appearance Cloudy   Urine Color Yellow   Urine Glucose Neg mg/dL  Urine Bilirubin Neg mg/dL  Urine Ketones Neg mg/dL  Urine Specific Gravity 1.025   Urine Blood Trace ery/uL  Urine pH 5.5   Urine Protein Trace mg/dL  Urine Urobilinogen 0.2 mg/dL  Urine Nitrites Neg   Urine Leukocyte Esterase 3+ leu/uL  Urine WBC/hpf >60/hpf   Urine RBC/hpf 3 - 10/hpf   Urine Epithelial Cells 0 - 5/hpf   Urine Bacteria Few (10-25/hpf)   Urine Mucous Present   Urine Yeast NS (Not Seen)   Urine Trichomonas Not Present   Urine Cystals NS (Not Seen)   Urine Casts NS (Not Seen)   Urine Sperm Not Present    PROCEDURES:         C.T. Urogram - [id number]      Patient confirmed No Neulasta OnPro Device.         Urinalysis w/Scope Dipstick Dipstick Cont'd Micro  Color: Yellow Bilirubin: Neg mg/dL WBC/hpf: >60/hpf  Appearance: Cloudy Ketones: Neg mg/dL RBC/hpf: 3 - 10/hpf  Specific Gravity: 1.025 Blood: Trace ery/uL Bacteria: Few (10-25/hpf)  pH: 5.5 Protein: Trace mg/dL Cystals: NS (Not Seen)  Glucose: Neg mg/dL Urobilinogen: 0.2 mg/dL Casts: NS (Not Seen)    Nitrites: Neg Trichomonas: Not Present    Leukocyte Esterase: 3+ leu/uL Mucous: Present      Epithelial Cells: 0 - 5/hpf      Yeast: NS (Not Seen)      Sperm: Not Present          Ketoralac 60mg  - [id number], [id number] The area was prepped and cleaned using sterile technique. A band aid was applied. The pt tolerated well.   Qty: 60 Adm. By: Christella Scheuermann  Unit: mg Lot No [id number]  Route: IM Exp. Date 08/05/2021  Freq: None Mfgr.:   Site: Left Buttock         Morphine 4mg  - J2270, [id number] Qty: 4 Adm. By: Christella Scheuermann  Unit: mg Lot No [id number]  Route: IM Exp. Date 06/05/2021  Freq: None Mfgr.:   Site: Right Buttock         Phenergan 25mg  - J2550, [id number] Qty: 25 Adm. By: Christella Scheuermann  Unit: mg Lot No [id number]  Route: IM Exp. Date 05/06/2021  Freq: None Mfgr.:   Site: Right Buttock   ASSESSMENT:      ICD-10 Details  1 GU:   Ureteral calculus - N20.1 Left, Acute, Systemic Symptoms  PLAN:            Medications New Meds: Percocet 5 mg-325 mg tablet 1-2 tablet PO Q 6 H PRN for severe pain  #20  0 Refill(s)  Tamsulosin Hcl 0.4 mg capsule 1 capsule PO Q HS   #30  0 Refill(s)  Ondansetron Odt 4 mg tablet,disintegrating 1 tablet PO Q 6 H PRN For nausea  #30  1 Refill(s)            Orders Labs BUN/Creatinine, CULTURE, URINE  X-Rays: C.T. Stone Protocol Without Contrast - The patient has had an xray and a [place] prior to arrival at her PCP office. She has microscopic hematuria, left flank pain and hydronephrosisi on the left./  X-Ray Notes: History:  Hematuria: Yes/No  Patient to see MD after exam: Yes/No  Previous exam: CT / IVP/ US/ KUB/ None  When:  Where:  Diabetic: Yes/ No  BUN/ Creatine:  Date of last BUN Creatinine:  Weight in pounds:  Allergy- Contrasts/ Shellfish: Yes/ No  Conflicting diabetic meds: Yes/ No  Oral contrast and instructions given to patient:   Prior Authorization #: [id number]            Schedule Procedure: 06/10/2020 at [facility] - [place] 25mg  (Phenergan Per 50 Mg) - [place], [phone]  Procedure: 06/10/2020 at [facility] - [phone] - Morphine 4mg  (Ther/Proph/Diag Inj,  [place]/Im) - [id number], [id number]  Procedure: 06/10/2020 at [facility] - [phone] - Ketoralac 60mg  (Toradol Per 15 Mg) - O0321, [id number]          Document Letter(s):  Created for Patient: Clinical Summary         Notes:   Urinalysis sent for precautionary culture today. The due to pain and discomfort she was given IM Phenergan, morphine, Toradol today. CT scan ordered which did show a concern for obstructing stone within the proximal left ureter as well as a possible stone within the mid to distal left ureter with hydroureteronephrosis. I discussed this scan in the results in detail with her today as well as discussed with Dr.Selita Staiger. The it was advised that a ureteroscopy would be the best course of action to take at this time. The risks and the benefits of ureteroscopy were discussed in detail today including risk for infection, bleeding, injury to surrounding structures, risks of general anesthesia, and failure of procedure. She verbalized understanding of these risks and wished to proceed. Green sheet was filled out and handed to the scheduler. Medications sent to her pharmacy include Flomax, Zofran for nausea, and oxycodone as needed for severe pain. Discussed strict return precautions in the interval. She verbalized understanding. [place] controlled substance database queried.

## 2020-06-11 NOTE — Op Note (Signed)
Preoperative diagnosis: left ureteral calculus  Postoperative diagnosis: left ureteral calculus  Procedure:  1. Cystoscopy 2. left ureteroscopy and stone removal 3. Ureteroscopic laser lithotripsy 4. left 20F x 24cm ureteral stent placement  5. left retrograde pyelography with interpretation  Surgeon: Ardis Hughs, MD  Anesthesia: General  Complications: None  Intraoperative findings: left retrograde pyelography demonstrated a filling defect within the left ureter consistent with the patient's known calculus without other abnormalities.  There was also a filling defect in the renal pelvis  EBL: Minimal  Specimens: 1. left ureteral calculus  Disposition of specimens: [facility] for stone analysis  Indication: Amanda Anthony is a 64 y.o.   patient with a left ureteral stone and associated left symptoms. After reviewing the management options for treatment, the patient elected to proceed with the above surgical procedure(s). We have discussed the potential benefits and risks of the procedure, side effects of the proposed treatment, the likelihood of the patient achieving the goals of the procedure, and any potential problems that might occur during the procedure or recuperation. Informed consent has been obtained.   Description of procedure:  The patient was taken to the operating room and general anesthesia was induced.  The patient was placed in the dorsal lithotomy position, prepped and draped in the usual sterile fashion, and preoperative antibiotics were administered. A preoperative time-out was performed.   Cystourethroscopy was performed.  The patient's urethra was examined and was normal.   The bladder was then systematically examined in its entirety. There was no evidence for any bladder tumors, stones, or other mucosal pathology.    Attention then turned to the left ureteral orifice and a ureteral catheter was used to intubate the ureteral  orifice.  Omnipaque contrast was injected through the ureteral catheter and a retrograde pyelogram was performed with findings as dictated above.  A 0.38 sensor guidewire was then advanced up the left ureter into the renal pelvis under fluoroscopic guidance. The 6 Fr semirigid ureteroscope was then advanced into the ureter next to the guidewire and the calculus was identified.   The stone was then fragmented with the 365 micron holmium laser fiber on a setting of 1.0 and frequency of 6 Hz.   All stones were then removed from the ureter with an N-gage nitinol basket.  Reinspection of the ureter revealed no remaining visible stones or fragments.  I then advanced a wire through the scope and remove the scope over the wire.  I subsequently advanced a flexible ureteroscope over the wire and perform pyeloscopy.  I encountered a second stone in the midpole calyx which I was able to move to the upper pole.  I then opted at this point to pass an access sheath into the ureter.  I did this by exchanging the flexible ureteroscope for a sensor wire.  Then using fluoroscopy I advanced a 12/14 French 38 cm ureteral access sheath up into the proximal ureter.  I then remove the inner portion of the sheath as well as the wire and using the 365 m laser fiber I was able to fragment the stone into about 5 pieces.  I remove the stone with the engage basket.  Reinspection of the calyx demonstrated no residual stone fragments.  I then explored the remainder of the collecting system and noted no additional stones or stone fragments.  I then slowly backed the ureteroscope and the access sheath simultaneously noting no significant ureteral trauma.  The wire was then backloaded through the cystoscope and  a ureteral stent was advance over the wire using Seldinger technique.  The stent was positioned appropriately under fluoroscopic and cystoscopic guidance.  The wire was then removed with an adequate stent curl noted in the renal  pelvis as well as in the bladder.  The bladder was then emptied and the procedure ended.  The patient appeared to tolerate the procedure well and without complications.  The patient was able to be awakened and transferred to the recovery unit in satisfactory condition.   Disposition: The tether of the stent was left on and tucked inside the patient's vagina.  Instructions for removing the stent have been provided to the patient. The patient has been scheduled for followup in 6 weeks with a renal ultrasound.

## 2020-06-11 NOTE — Anesthesia Preprocedure Evaluation (Signed)
Anesthesia Evaluation  Patient identified by MRN, date of birth, ID band Patient awake    Reviewed: Allergy & Precautions, NPO status , Patient's Chart, lab work & pertinent test results  History of Anesthesia Complications (+) PONV and history of anesthetic complications  Airway Mallampati: III  TM Distance: >3 FB Neck ROM: Full    Dental  (+) Teeth Intact   Pulmonary neg pulmonary ROS,    Pulmonary exam normal breath sounds clear to auscultation       Cardiovascular Exercise Tolerance: Good hypertension, Normal cardiovascular exam Rhythm:Regular Rate:Normal     Neuro/Psych PSYCHIATRIC DISORDERS Anxiety Depression negative neurological ROS     GI/Hepatic Neg liver ROS, GERD  Medicated and Controlled,  Endo/Other  diabetes  Renal/GU Renal disease     Musculoskeletal negative musculoskeletal ROS (+)   Abdominal   Peds  Hematology negative hematology ROS (+)   Anesthesia Other Findings   Reproductive/Obstetrics                             Anesthesia Physical  Anesthesia Plan  ASA: II  Anesthesia Plan: General   Post-op Pain Management:    Induction: Intravenous  PONV Risk Score and Plan: 4 or greater and Ondansetron, Dexamethasone, Midazolam, Propofol infusion and TIVA  Airway Management Planned: LMA  Additional Equipment: None  Intra-op Plan:   Post-operative Plan: Extubation in OR  Informed Consent: I have reviewed the patients History and Physical, chart, labs and discussed the procedure including the risks, benefits and alternatives for the proposed anesthesia with the patient or authorized representative who has indicated his/her understanding and acceptance.       Plan Discussed with: CRNA and Surgeon  Anesthesia Plan Comments:         Anesthesia Quick Evaluation

## 2020-06-12 ENCOUNTER — Encounter (HOSPITAL_COMMUNITY): Payer: Self-pay | Admitting: Urology

## 2020-06-29 ENCOUNTER — Other Ambulatory Visit: Payer: Self-pay

## 2020-06-29 ENCOUNTER — Ambulatory Visit
Admission: RE | Admit: 2020-06-29 | Discharge: 2020-06-29 | Disposition: A | Discharge status: Home / Self Care | Payer: BC Managed Care – PPO | Source: Ambulatory Visit | Attending: Obstetrics | Admitting: Obstetrics

## 2020-06-29 DIAGNOSIS — Z1231 Encounter for screening mammogram for malignant neoplasm of breast: Secondary | ICD-10-CM

## 2020-07-01 ENCOUNTER — Encounter: Payer: BC Managed Care – PPO | Admitting: Gastroenterology

## 2020-08-19 DIAGNOSIS — I10 Essential (primary) hypertension: Secondary | ICD-10-CM

## 2020-08-19 DIAGNOSIS — R079 Chest pain, unspecified: Secondary | ICD-10-CM

## 2020-09-10 DIAGNOSIS — N852 Hypertrophy of uterus: Secondary | ICD-10-CM | POA: Insufficient documentation

## 2020-09-10 DIAGNOSIS — E119 Type 2 diabetes mellitus without complications: Secondary | ICD-10-CM | POA: Insufficient documentation

## 2020-09-10 DIAGNOSIS — N809 Endometriosis, unspecified: Secondary | ICD-10-CM | POA: Insufficient documentation

## 2020-09-10 DIAGNOSIS — N3281 Overactive bladder: Secondary | ICD-10-CM | POA: Insufficient documentation

## 2020-09-10 DIAGNOSIS — M810 Age-related osteoporosis without current pathological fracture: Secondary | ICD-10-CM | POA: Insufficient documentation

## 2020-09-10 DIAGNOSIS — I341 Nonrheumatic mitral (valve) prolapse: Secondary | ICD-10-CM | POA: Insufficient documentation

## 2020-12-02 ENCOUNTER — Encounter: Payer: BC Managed Care – PPO | Admitting: Gastroenterology

## 2020-12-07 ENCOUNTER — Encounter: Payer: Self-pay | Admitting: Physician Assistant

## 2020-12-23 ENCOUNTER — Ambulatory Visit: Payer: Self-pay | Admitting: Physician Assistant

## 2021-01-07 ENCOUNTER — Encounter: Payer: Self-pay | Admitting: Physician Assistant

## 2021-01-07 ENCOUNTER — Ambulatory Visit: Payer: BC Managed Care – PPO | Admitting: Physician Assistant

## 2021-01-07 VITALS — BP 140/80 | HR 76 | Ht 65.0 in | Wt 199.0 lb

## 2021-01-07 DIAGNOSIS — Z800 Family history of malignant neoplasm of digestive organs: Secondary | ICD-10-CM | POA: Diagnosis not present

## 2021-01-07 DIAGNOSIS — Z86010 Personal history of colonic polyps: Secondary | ICD-10-CM | POA: Diagnosis not present

## 2021-01-07 DIAGNOSIS — R194 Change in bowel habit: Secondary | ICD-10-CM | POA: Diagnosis not present

## 2021-01-07 MED ORDER — PLENVU 140 G PO SOLR: ORAL | 0 refills | Status: DC

## 2021-01-07 NOTE — Progress Notes (Signed)
Chief Complaint: Constipation  HPI:    Amanda Anthony is a 65 year old female, known to Dr. Fuller Plan, with a past medical history as listed below, who was referred to me by Bonnita Nasuti, MD for a complaint of constipation.      04/07/2015 colonoscopy with a sessile polyp in the descending colon and grade 1 internal hemorrhoids.  Repeat recommended in 5 years given family history of colon cancer.    11/10/2015 office visit with Dr. Fuller Plan for GERD.  She was maintained on pantoprazole 40 mg daily.  At that time she was scheduled for an EGD.  Also surveillance colonoscopy recommended August 2021.    12/15/2015 EGD with a few gastric polyps and a small hiatal hernia.    Today, the patient tells me that over the past 6 to 8 months she has had a complete change in bowel habits noting that prior to this she was "as regular as clockwork", and now she has to take a stool softener or some other kind of laxative once a week in order to have any bowel movement at all.  Does discuss that she had COVID back in November prior to onset of symptoms.  Patient is somewhat worried given family history of colon cancer in her grandmother and aunt.  Associated symptoms include an increase in eructations.    Denies fever, chills, blood in her stool, weight loss, nausea, vomiting, heartburn or reflux.  Past Medical History:  Diagnosis Date  . Abdominal pain, left lower quadrant 11/13/2007   Qualifier: Diagnosis of  By: Lurlean Nanny LPN, Regina    . Allergic rhinitis   . Allergy   . Anxiety 03/13/2017  . Chronic pain of both knees 11/12/2017  . Depression   . 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 Diabetes mellitus   . DIABETES MELLITUS, GESTATIONAL, HX OF 11/13/2007   Qualifier: Diagnosis of  By: Lurlean Nanny LPN, Regina    Overview:  Overview:  Qualifier: Diagnosis of  By: Lurlean Nanny LPN, Regina   . GERD (gastroesophageal reflux disease)   . HEMATOCHEZIA 11/13/2007   Qualifier: Diagnosis of  By: Lurlean Nanny LPN, Regina    . Hematochezia  11/13/2007   Overview:  Overview:  Qualifier: Diagnosis of  By: Lurlean Nanny LPN, Regina   . Hemorrhoids, internal 11/13/2007   history of with pregnancy Qualifier: Diagnosis of  By: Lurlean Nanny LPN, Regina    Overview:  Overview:  Qualifier: Diagnosis of  By: Lurlean Nanny LPN, Regina   . History of kidney stones   . Hx of chest pain 11/15/2017  . Hypercholesteremia 04/17/2017  . Hypertension 11/13/2007   pt not tretaed for HTN , patient unawareQualifier: Diagnosis of  By: Lurlean Nanny LPN, Regina    Overview:  Overview:  Qualifier: Diagnosis of  By: Lurlean Nanny LPN, Regina -   . Insomnia   . Neuromuscular disorder (HCC)    neuropathy   . Neuropathy 05/17/2017  . Osteopenia   . PONV (postoperative nausea and vomiting)   . Rash of groin 03/14/2017  . Residual hemorrhoidal skin tags 11/13/2007   Qualifier: Diagnosis of  By: Lurlean Nanny LPN, Regina    Overview:  Overview:  Qualifier: Diagnosis of  By: Lurlean Nanny LPN, Regina   . Tubular adenoma of colon 04/2015  . UTI (urinary tract infection)   . 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 Vitamin B 12 deficiency 04/18/2017  . Vitamin D deficiency     Past Surgical History:  Procedure Laterality Date  . APPENDECTOMY    .  COLONOSCOPY  2011   hyperplastic polyps  . CYSTOSCOPY WITH RETROGRADE PYELOGRAM, URETEROSCOPY AND STENT PLACEMENT Left 06/11/2020   Procedure: CYSTOSCOPY WITH RETROGRADE PYELOGRAM, URETEROSCOPY AND STENT PLACEMENT;  Surgeon: Ardis Hughs, MD;  Location: [facility];  Service: Urology;  Laterality: Left;  75 MINS  . CYSTOSCOPY/URETEROSCOPY/HOLMIUM LASER/STENT PLACEMENT Left 03/27/2019   Procedure: CYSTOSCOPY LEFT URETEROSCOPY/HOLMIUM LASER/STENT EXCHANGE;  Surgeon: Ardis Hughs, MD;  Location: [facility];  Service: Urology;  Laterality: Left;  . ENDOMETRIAL CRYOABLATION     laser to removed the endometriosis  . HOLMIUM LASER APPLICATION Left 62/04/3150   Procedure: HOLMIUM LASER APPLICATION;  Surgeon: Ardis Hughs, MD;  Location: [facility];  Service: Urology;  Laterality: Left;   . KIDNEY STONE SURGERY  03/18/2019  . POLYPECTOMY    . TONSILLECTOMY AND ADENOIDECTOMY    . UPPER GASTROINTESTINAL ENDOSCOPY    . UPPER GI ENDOSCOPY      Current Outpatient Medications  Medication Sig Dispense Refill  . Calcium 200 MG TABS Take 200 mg by mouth daily with supper.    . Cholecalciferol (VITAMIN D-3) 125 MCG (5000 UT) TABS Take 5,000 Units by mouth at bedtime.    . cyanocobalamin (,VITAMIN B-12,) 1000 MCG/ML injection Inject 1,000 mcg into the muscle every 30 (thirty) days.   11  . escitalopram (LEXAPRO) 20 MG tablet Take 10 mg by mouth at bedtime.     . gabapentin (NEURONTIN) 600 MG tablet Take 1,200 mg by mouth at bedtime.     . metFORMIN (GLUCOPHAGE-XR) 500 MG 24 hr tablet Take 500 mg by mouth at bedtime.     Marland Kitchen MYRBETRIQ 50 MG TB24 tablet Take 50 mg by mouth at bedtime.     . naproxen sodium (ALEVE) 220 MG tablet Take 440 mg by mouth 2 (two) times daily as needed (pain.).    Marland Kitchen nitroGLYCERIN (NITROSTAT) 0.4 MG SL tablet Place 1 tablet (0.4 mg total) under the tongue every 5 (five) minutes as needed for chest pain. 25 tablet 3  . oxyCODONE-acetaminophen (PERCOCET/ROXICET) 5-325 MG tablet Take 1 tablet by mouth every 6 (six) hours as needed for pain.    . pantoprazole (PROTONIX) 40 MG tablet Take 1 tablet (40 mg total) by mouth 2 (two) times daily. (Patient taking differently: Take 40 mg by mouth at bedtime as needed (heartburn). ) 60 tablet 11  . PEG-KCl-NaCl-NaSulf-Na Asc-C (PLENVU) 140 g SOLR Take 1 kit by mouth as directed. Manufacturer's coupon Universal coupon code:BIN: P2366821; GROUP: [id number]; PCN: [id number]; ID: [id number]; PAY NO MORE $50 1 each 0  . phenazopyridine (PYRIDIUM) 200 MG tablet Take 1 tablet (200 mg total) by mouth 3 (three) times daily as needed for pain. 10 tablet 0  . pravastatin (PRAVACHOL) 40 MG tablet Take 40 mg by mouth at bedtime.     . promethazine (PHENERGAN) 12.5 MG tablet Take 1 tablet (12.5 mg total) by mouth every 6 (six) hours as needed for  nausea or vomiting. 20 tablet 0  . tamsulosin (FLOMAX) 0.4 MG CAPS capsule Take 0.4 mg by mouth daily.    . traZODone (DESYREL) 100 MG tablet Take 100 mg by mouth at bedtime as needed for sleep.      No current facility-administered medications for this visit.    Allergies as of 01/07/2021 - Review Complete 06/11/2020  Allergen Reaction Noted  . Other  03/27/2019  . Zocor [simvastatin] Other (See Comments) 11/10/2015    Family History  Problem Relation Age of Onset  . Diverticulitis Father   .  Diabetes Father   . Heart attack Father   . Heart disease Mother   . Heart attack Mother   . Lung cancer Sister        died age 46-smoker   . Colon polyps Sister   . Colon cancer Maternal Aunt   . Colon cancer Maternal Grandmother   . Breast cancer Paternal Grandmother   . Esophageal cancer Neg Hx   . Rectal cancer Neg Hx   . Stomach cancer Neg Hx     Social History   Socioeconomic History  . Marital status: Married    Spouse name: Not on file  . Number of children: 1  . Years of education: Not on file  . Highest education level: Not on file  Occupational History  . 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 Smoking status: Never Smoker  . Smokeless tobacco: Never Used  Vaping Use  . Vaping Use: Never used  Substance and Sexual Activity  . Alcohol use: Yes    Alcohol/week: 0.0 standard drinks    Comment: RARE  . Drug use: No  . Sexual activity: Yes    Birth control/protection: Post-menopausal  Other Topics Concern  . Not on file  Social History Narrative  . Not on file   Social Determinants of Health   Financial Resource Strain: Not on file  Food Insecurity: Not on file  Transportation Needs: Not on file  Physical Activity: Not on file  Stress: Not on file  Social Connections: Not on file  Intimate Partner Violence: Not on file    Review of Systems:    Constitutional: No weight loss, fever or chills Skin: No rash  Cardiovascular: No chest  pain Respiratory: No SOB Gastrointestinal: See HPI and otherwise negative Genitourinary: No dysuria  Neurological: No headache, dizziness or syncope Musculoskeletal: No new muscle or joint pain Hematologic: No bleeding  Psychiatric: No history of depression or anxiety    Physical Exam:  Vital signs: BP 140/80   Pulse 76   Ht _0  (1.651 m)   Wt 199 lb (90.3 kg)   BMI 33.12 kg/m   Constitutional:   Pleasant Caucasian female appears to be in NAD, Well developed, Well nourished, alert and cooperative Head:  Normocephalic and atraumatic. Eyes:   PEERL, EOMI. No icterus. Conjunctiva pink. Ears:  Normal auditory acuity. Neck:  Supple Throat: Oral cavity and pharynx without inflammation, swelling or lesion.  Respiratory: Respirations even and unlabored. Lungs clear to auscultation bilaterally.   No wheezes, crackles, or rhonchi.  Cardiovascular: Normal S1, S2. No MRG. Regular rate and rhythm. No peripheral edema, cyanosis or pallor.  Gastrointestinal:  Soft, nondistended, nontender. No rebound or guarding. Normal bowel sounds. No appreciable masses or hepatomegaly. Rectal:  Not performed.  Msk:  Symmetrical without gross deformities. Without edema, no deformity or joint abnormality.  Neurologic:  Alert and  oriented x4;  grossly normal neurologically.  Skin:   Dry and intact without significant lesions or rashes. Psychiatric: Demonstrates good judgement and reason without abnormal affect or behaviors.  No recent labs or imaging.  Assessment: 1.  Change in bowel habits: Towards constipation over the past 6 to 8 months; consider post-COVID effect versus other 2.  Family history of colon cancer: In her grandmother and aunt 74.  History of adenomatous polyp: On colonoscopy 5 years ago, repeat recommended in 5 years  Plan: 1.  Scheduled patient for her surveillance colonoscopy in the [place] with Dr. Fuller Plan.  Did provide the patient with a  detailed list of risks for the procedure and she  agrees to proceed.  She was given a 2-day bowel prep. 2.  Would recommend the patient start MiraLAX once daily.  Discussed titration of this up to 4 times a day if needed. 3.  Patient to follow in clinic per recommendations from Dr. Fuller Plan after time of procedure.  Ellouise Newer, PA-C [facility] 01/07/2021, 9:30 AM  Cc: Bonnita Nasuti, MD

## 2021-01-07 NOTE — Progress Notes (Signed)
Reviewed and agree with management plan.  Sharlee Rufino T. Nabria Nevin, MD FACG [phone]  

## 2021-01-07 NOTE — Patient Instructions (Signed)
If you are age 65 or older, your body mass index should be between 23-30. Your Body mass index is 33.12 kg/m. If this is out of the aforementioned range listed, please consider follow up with your Primary Care Provider.  If you are age 72 or younger, your body mass index should be between 19-25. Your Body mass index is 33.12 kg/m. If this is out of the aformentioned range listed, please consider follow up with your Primary Care Provider.   You have been scheduled for a colonoscopy. Please follow written instructions given to you at your visit today.  Please pick up your prep supplies at the pharmacy within the next 1-3 days. If you use inhalers (even only as needed), please bring them with you on the day of your procedure.   Start Miralax once daily.  Thank you for choosing me and [facility].  Ellouise Newer, PA-C

## 2021-01-14 ENCOUNTER — Encounter: Payer: Self-pay | Admitting: Gastroenterology

## 2021-01-22 ENCOUNTER — Ambulatory Visit (AMBULATORY_SURGERY_CENTER): Payer: BC Managed Care – PPO | Admitting: Gastroenterology

## 2021-01-22 ENCOUNTER — Encounter: Payer: Self-pay | Admitting: Gastroenterology

## 2021-01-22 ENCOUNTER — Other Ambulatory Visit: Payer: Self-pay

## 2021-01-22 VITALS — BP 135/84 | HR 82 | Temp 98.0°F | Resp 12 | Ht 65.0 in | Wt 199.0 lb

## 2021-01-22 DIAGNOSIS — Z86010 Personal history of colonic polyps: Secondary | ICD-10-CM

## 2021-01-22 DIAGNOSIS — K5730 Diverticulosis of large intestine without perforation or abscess without bleeding: Secondary | ICD-10-CM | POA: Diagnosis not present

## 2021-01-22 DIAGNOSIS — R194 Change in bowel habit: Secondary | ICD-10-CM

## 2021-01-22 DIAGNOSIS — Z800 Family history of malignant neoplasm of digestive organs: Secondary | ICD-10-CM

## 2021-01-22 DIAGNOSIS — K640 First degree hemorrhoids: Secondary | ICD-10-CM

## 2021-01-22 DIAGNOSIS — D123 Benign neoplasm of transverse colon: Secondary | ICD-10-CM

## 2021-01-22 HISTORY — PX: COLONOSCOPY: SHX174

## 2021-01-22 MED ORDER — SODIUM CHLORIDE 0.9 % IV SOLN: 500.0000 mL | Freq: Once | INTRAVENOUS | Status: DC

## 2021-01-22 NOTE — Progress Notes (Signed)
Pt's states no medical or surgical changes since previsit or office visit.  Vitals [place] 

## 2021-01-22 NOTE — Progress Notes (Signed)
No problems noted in the recovery room. maw 

## 2021-01-22 NOTE — Progress Notes (Signed)
Called to room to assist during endoscopic procedure.  Patient ID and intended procedure confirmed with present staff. Received instructions for my participation in the procedure from the performing physician.  

## 2021-01-22 NOTE — Progress Notes (Signed)
Report to PACU, RN, vss, BBS= Clear.  

## 2021-01-22 NOTE — Op Note (Signed)
[facility] Patient Name: Amanda Anthony Procedure Date: 01/22/2021 2:19 PM MRN: [id number] Endoscopist: Ladene Artist , MD Age: 65 Referring MD:  Date of Birth: [birth year] Gender: Female Account #: [id number] Procedure:                Colonoscopy Indications:              Surveillance: Personal history of adenomatous                            polyps on last colonoscopy > 5 years ago. Family                            history of colon cancer, multiple second degree                            relatives. Change in bowel habits. Medicines:                Monitored Anesthesia Care Procedure:                Pre-Anesthesia Assessment:                           - Prior to the procedure, a History and Physical                            was performed, and patient medications and                            allergies were reviewed. The patient's tolerance of                            previous anesthesia was also reviewed. The risks                            and benefits of the procedure and the sedation                            options and risks were discussed with the patient.                            All questions were answered, and informed consent                            was obtained. Prior Anticoagulants: The patient has                            taken no previous anticoagulant or antiplatelet                            agents. ASA Grade Assessment: II - A patient with                            mild systemic disease. After reviewing the risks  and benefits, the patient was deemed in                            satisfactory condition to undergo the procedure.                           After obtaining informed consent, the colonoscope                            was passed under direct vision. Throughout the                            procedure, the patient's blood pressure, pulse, and                            oxygen saturations were  monitored continuously. The                            Olympus CF-HQ190 [id number]) Colonoscope was                            introduced through the anus and advanced to the the                            cecum, identified by appendiceal orifice and                            ileocecal valve. The ileocecal valve, appendiceal                            orifice, and rectum were photographed. The quality                            of the bowel preparation was good. The colonoscopy                            was performed without difficulty. The patient                            tolerated the procedure well. Scope In: 2:32:09 PM Scope Out: 2:51:15 PM Scope Withdrawal Time: 0 hours 16 minutes 40 seconds  Total Procedure Duration: 0 hours 19 minutes 6 seconds  Findings:                 The perianal and digital rectal examinations were                            normal.                           A 4 mm polyp was found in the transverse colon. The                            polyp was sessile. The polyp was removed with a  cold biopsy forceps. Resection and retrieval were                            complete.                           Two sessile polyps were found in the transverse                            colon. The polyps were 6 to 7 mm in size. These                            polyps were removed with a cold snare. Resection                            and retrieval were complete.                           A few small-mouthed diverticula were found in the                            left colon.                           Internal hemorrhoids were found during                            retroflexion. The hemorrhoids were small and Grade                            I (internal hemorrhoids that do not prolapse).                           The exam was otherwise without abnormality on                            direct and retroflexion views. Complications:            No  immediate complications. Estimated blood loss:                            None. Estimated Blood Loss:     Estimated blood loss: none. Impression:               - One 4 mm polyp in the transverse colon, removed                            with a cold biopsy forceps. Resected and retrieved.                           - Two 6 to 7 mm polyps in the transverse colon,                            removed with a cold snare. Resected and retrieved.                           -  Mild diverticulosis in the left colon.                           - Internal hemorrhoids.                           - The examination was otherwise normal on direct                            and retroflexion views. Recommendation:           - Repeat colonoscopy in 3 - 5 years for                            surveillance based on pathology results with an                            extended bowel prep.                           - Patient has a contact number available for                            emergencies. The signs and symptoms of potential                            delayed complications were discussed with the                            patient. Return to normal activities tomorrow.                            Written discharge instructions were provided to the                            patient.                           - Resume previous diet.                           - Continue present medications.                           - Await pathology results. Ladene Artist, MD 01/22/2021 2:56:45 PM This report has been signed electronically.

## 2021-01-22 NOTE — Patient Instructions (Addendum)
Handouts were given to your care partner on polyps, diverticulosis, and hemorrhoids. Your sugar was 106 in the recovery room. You may resume your current medications today. Await biopsy results.  May take 1-3 weeks to receive pathology results. Please call if any questions or concerns.    YOU HAD AN ENDOSCOPIC PROCEDURE TODAY AT [facility]:   Refer to the procedure report that was given to you for any specific questions about what was found during the examination.  If the procedure report does not answer your questions, please call your gastroenterologist to clarify.  If you requested that your care partner not be given the details of your procedure findings, then the procedure report has been included in a sealed envelope for you to review at your convenience later.  YOU SHOULD EXPECT: Some feelings of bloating in the abdomen. Passage of more gas than usual.  Walking can help get rid of the air that was put into your GI tract during the procedure and reduce the bloating. If you had a lower endoscopy (such as a colonoscopy or flexible sigmoidoscopy) you may notice spotting of blood in your stool or on the toilet paper. If you underwent a bowel prep for your procedure, you may not have a normal bowel movement for a few days.  Please Note:  You might notice some irritation and congestion in your nose or some drainage.  This is from the oxygen used during your procedure.  There is no need for concern and it should clear up in a day or so.  SYMPTOMS TO REPORT IMMEDIATELY:   Following lower endoscopy (colonoscopy or flexible sigmoidoscopy):  Excessive amounts of blood in the stool  Significant tenderness or worsening of abdominal pains  Swelling of the abdomen that is new, acute  Fever of 100F or higher    For urgent or emergent issues, a gastroenterologist can be reached at any hour by calling [phone]. Do not use MyChart messaging for urgent concerns.    DIET:  We  do recommend a small meal at first, but then you may proceed to your regular diet.  Drink plenty of fluids but you should avoid alcoholic beverages for 24 hours.  ACTIVITY:  You should plan to take it easy for the rest of today and you should NOT DRIVE or use heavy machinery until tomorrow (because of the sedation medicines used during the test).    FOLLOW UP: Our staff will call the number listed on your records 48-72 hours following your procedure to check on you and address any questions or concerns that you may have regarding the information given to you following your procedure. If we do not reach you, we will leave a message.  We will attempt to reach you two times.  During this call, we will ask if you have developed any symptoms of COVID 19. If you develop any symptoms (ie: fever, flu-like symptoms, shortness of breath, cough etc.) before then, please call [phone].  If you test positive for Covid 19 in the 2 weeks post procedure, please call and report this information to [place].    If any biopsies were taken you will be contacted by phone or by letter within the next 1-3 weeks.  Please call us at [phone] if you have not heard about the biopsies in 3 weeks.    SIGNATURES/CONFIDENTIALITY: You and/or your care partner have signed paperwork which will be entered into your electronic medical record.  These signatures attest to the fact  that that the information above on your After Visit Summary has been reviewed and is understood.  Full responsibility of the confidentiality of this discharge information lies with you and/or your care-partner.

## 2021-01-26 ENCOUNTER — Telehealth: Payer: Self-pay

## 2021-01-26 NOTE — Telephone Encounter (Signed)
  Follow up Call-  Call back number 01/22/2021  Post procedure Call Back phone  # [phone]  Permission to leave phone message Yes  Some recent data might be hidden     Patient questions:  Do you have a fever, pain , or abdominal swelling? No. Pain Score  0 *  Have you tolerated food without any problems? Yes.    Have you been able to return to your normal activities? Yes.    Do you have any questions about your discharge instructions: Diet   No. Medications  No. Follow up visit  No.  Do you have questions or concerns about your Care? No.  Actions: * If pain score is 4 or above: No action needed, pain <4.  1. Have you developed a fever since your procedure? no  2.   Have you had an respiratory symptoms (SOB or cough) since your procedure? no  3.   Have you tested positive for COVID 19 since your procedure no  4.   Have you had any family members/close contacts diagnosed with the COVID 19 since your procedure?  no   If yes to any of these questions please route to Joylene John, RN and Joella Prince, RN

## 2021-01-26 NOTE — Telephone Encounter (Signed)
First post procedure follow up call, no answer 

## 2021-02-11 ENCOUNTER — Encounter: Payer: Self-pay | Admitting: Gastroenterology

## 2021-06-11 ENCOUNTER — Other Ambulatory Visit: Payer: Self-pay | Admitting: Obstetrics

## 2021-06-11 DIAGNOSIS — Z1231 Encounter for screening mammogram for malignant neoplasm of breast: Secondary | ICD-10-CM

## 2021-07-13 ENCOUNTER — Other Ambulatory Visit: Payer: Self-pay

## 2021-07-13 ENCOUNTER — Ambulatory Visit
Admission: RE | Admit: 2021-07-13 | Discharge: 2021-07-13 | Disposition: A | Discharge status: Home / Self Care | Payer: BC Managed Care – PPO | Source: Ambulatory Visit | Attending: Obstetrics | Admitting: Obstetrics

## 2021-07-13 DIAGNOSIS — Z1231 Encounter for screening mammogram for malignant neoplasm of breast: Secondary | ICD-10-CM

## 2021-08-03 ENCOUNTER — Other Ambulatory Visit: Payer: Self-pay | Admitting: Nurse Practitioner

## 2021-08-03 DIAGNOSIS — R5381 Other malaise: Secondary | ICD-10-CM

## 2021-08-06 ENCOUNTER — Other Ambulatory Visit: Payer: Self-pay | Admitting: Nurse Practitioner

## 2021-08-06 DIAGNOSIS — N959 Unspecified menopausal and perimenopausal disorder: Secondary | ICD-10-CM

## 2021-11-18 DIAGNOSIS — R1312 Dysphagia, oropharyngeal phase: Secondary | ICD-10-CM | POA: Insufficient documentation

## 2022-01-03 ENCOUNTER — Ambulatory Visit: Payer: Medicare PPO | Admitting: Physician Assistant

## 2022-01-03 ENCOUNTER — Encounter: Payer: Self-pay | Admitting: Physician Assistant

## 2022-01-03 VITALS — BP 104/64 | HR 76 | Ht 64.5 in | Wt 144.6 lb

## 2022-01-03 DIAGNOSIS — R1310 Dysphagia, unspecified: Secondary | ICD-10-CM | POA: Diagnosis not present

## 2022-01-03 DIAGNOSIS — R933 Abnormal findings on diagnostic imaging of other parts of digestive tract: Secondary | ICD-10-CM

## 2022-01-03 DIAGNOSIS — R059 Cough, unspecified: Secondary | ICD-10-CM

## 2022-01-03 NOTE — Progress Notes (Signed)
? ?Chief Complaint: GERD, cough and dysphagia ? ?HPI: ?   Amanda Anthony is a 66 year old female with a past medical history as listed below, known to Dr. Fuller Plan, who was referred to me by Charlynn Court, NP for a complaint of GERD, cough and dysphagia. ?   12/15/2015 EGD with a few gastric polyps and a small hiatal hernia. ?   01/07/2021 patient seen in clinic for a change in bowel habits and a family history of colon cancer.  She was scheduled for surveillance colonoscopy in the [place] with a 2-day bowel prep and told to start MiraLAX once a day. ?   01/22/2021 colonoscopy with one 4 mm polyp in the transverse colon, 2 6-7 mm polyps in the transverse colon, mild diverticulosis in the left colon and internal hemorrhoids.  Pathology showed tubular adenoma. ?   10/12/2021 CMP normal.  Hemoglobin A1c 5.8. ?   12/02/2021 barium swallow with tertiary contractions of the mid to distal esophagus seen in the prone drinking positioning consistent with some degree of presbyesophagus, also small sliding hiatal hernia and delay in passage of a 13 mm barium tablet in the distal esophagus just above the GE junction with suggesting possibly a subtle stricture or web in this region.  EGD recommended. ?   12/07/2021 patient saw PCP to discuss barium swallow results.  She had previously complained of choking sensation with solids and had a history of reflux. ?   Today, the patient tells me that over the past 6 to 8 months she has had a cough.  Tells me that she coughs up phlegm a lot of the time.  Also describes that she has been choking on almost everything that she puts in her mouth including even sometimes just broth but it is definitely worse with meats and breads.  Currently taking her Pantoprazole 40 mg at night and Famotidine 40 mg in the morning. ?   Denies fever, chills, weight loss, abdominal pain or change in bowel habits. ? ?Past Medical History:  ?Diagnosis Date  ? Abdominal pain, left lower quadrant 11/13/2007  ? Qualifier: Diagnosis  of  By: Lurlean Nanny LPN, [place]    ? Allergic rhinitis   ? Allergy   ? Anxiety 03/13/2017  ? Chronic pain of both knees 11/12/2017  ? Depression   ? 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 Diabetes mellitus   ? DIABETES MELLITUS, GESTATIONAL, HX OF 11/13/2007  ? Qualifier: Diagnosis of  By: Lurlean Nanny LPN, Regina    Overview:  Overview:  Qualifier: Diagnosis of  By: Lurlean Nanny LPN, Regina   ? GERD (gastroesophageal reflux disease)   ? HEMATOCHEZIA 11/13/2007  ? Qualifier: Diagnosis of  By: Lurlean Nanny LPN, Regina    ? Hematochezia 11/13/2007  ? Overview:  Overview:  Qualifier: Diagnosis of  By: Lurlean Nanny LPN, Regina   ? Hemorrhoids, internal 11/13/2007  ? history of with pregnancy Qualifier: Diagnosis of  By: Lurlean Nanny LPN, Regina    Overview:  Overview:  Qualifier: Diagnosis of  By: Lurlean Nanny LPN, Regina   ? History of kidney stones   ? Hx of chest pain 11/15/2017  ? Hypercholesteremia 04/17/2017  ? Hypertension 11/13/2007  ? pt not tretaed for HTN , patient unawareQualifier: Diagnosis of  By: Lurlean Nanny LPN, Regina    Overview:  Overview:  Qualifier: Diagnosis of  By: Lurlean Nanny LPN, Regina -   ? Insomnia   ? Neuromuscular disorder ([place])   ? neuropathy   ? Neuropathy 05/17/2017  ? Osteopenia   ?  PONV (postoperative nausea and vomiting)   ? Rash of groin 03/14/2017  ? Residual hemorrhoidal skin tags 11/13/2007  ? Qualifier: Diagnosis of  By: Lurlean Nanny LPN, Regina    Overview:  Overview:  Qualifier: Diagnosis of  By: Lurlean Nanny LPN, Regina   ? Tubular adenoma of colon 04/2015  ? UTI (urinary tract infection)   ? 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 Vitamin B 12 deficiency 04/18/2017  ? Vitamin D deficiency   ? ? ?Past Surgical History:  ?Procedure Laterality Date  ? APPENDECTOMY    ? COLONOSCOPY  2011  ? hyperplastic polyps  ? COLONOSCOPY  01/22/2021  ? MS  ? CYSTOSCOPY WITH RETROGRADE PYELOGRAM, URETEROSCOPY AND STENT PLACEMENT Left 06/11/2020  ? Procedure: [place], URETEROSCOPY AND STENT PLACEMENT;  Surgeon: Ardis Hughs, MD;  Location: [facility];   Service: Urology;  Laterality: Left;  75 MINS  ? CYSTOSCOPY/URETEROSCOPY/HOLMIUM LASER/STENT PLACEMENT Left 03/27/2019  ? Procedure: CYSTOSCOPY LEFT URETEROSCOPY/HOLMIUM LASER/STENT EXCHANGE;  Surgeon: Ardis Hughs, MD;  Location: [facility];  Service: Urology;  Laterality: Left;  ? ENDOMETRIAL CRYOABLATION    ? laser to removed the endometriosis  ? HOLMIUM LASER APPLICATION Left 58/01/2777  ? Procedure: HOLMIUM LASER APPLICATION;  Surgeon: Ardis Hughs, MD;  Location: [facility];  Service: Urology;  Laterality: Left;  ? KIDNEY STONE SURGERY  03/18/2019  ? POLYPECTOMY    ? TONSILLECTOMY AND ADENOIDECTOMY    ? UPPER GASTROINTESTINAL ENDOSCOPY    ? UPPER GI ENDOSCOPY    ? ? ?Current Outpatient Medications  ?Medication Sig Dispense Refill  ? Cholecalciferol (VITAMIN D-3) 125 MCG (5000 UT) TABS Take 5,000 Units by mouth at bedtime.    ? escitalopram (LEXAPRO) 20 MG tablet Take 1 tablet by mouth daily.    ? famotidine (PEPCID) 40 MG tablet Take 40 mg by mouth daily.    ? metFORMIN (GLUCOPHAGE-XR) 500 MG 24 hr tablet Take 500 mg by mouth at bedtime.     ? MYRBETRIQ 50 MG TB24 tablet Take 50 mg by mouth at bedtime.     ? OZEMPIC, 0.25 OR 0.5 MG/DOSE, 2 MG/1.5ML SOPN Inject into the skin daily.    ? pantoprazole (PROTONIX) 40 MG tablet Take 1 tablet (40 mg total) by mouth 2 (two) times daily. (Patient taking differently: Take 40 mg by mouth at bedtime as needed (heartburn).) 60 tablet 11  ? pravastatin (PRAVACHOL) 40 MG tablet Take 40 mg by mouth at bedtime.     ? traZODone (DESYREL) 100 MG tablet Take 100 mg by mouth at bedtime as needed for sleep.     ? ?No current facility-administered medications for this visit.  ? ? ?Allergies as of 01/03/2022 - Review Complete 01/22/2021  ?Allergen Reaction Noted  ? Pregabalin Shortness Of Breath 06/08/2020  ? Other  03/27/2019  ? Zocor [simvastatin] Other (See Comments) 11/10/2015  ? ? ?Family History  ?Problem Relation Age of Onset  ? Diverticulitis Father   ? Diabetes Father    ? Heart attack Father   ? Heart disease Mother   ? Heart attack Mother   ? Lung cancer Sister   ?     died age 39-smoker   ? Colon polyps Sister   ? Colon cancer Maternal Aunt   ? Colon cancer Maternal Grandmother   ? Breast cancer Paternal Grandmother   ? Esophageal cancer Neg Hx   ? Rectal cancer Neg Hx   ? Stomach cancer Neg Hx   ? ? ?Social History  ? ?  Socioeconomic History  ? Marital status: Married  ?  Spouse name: Not on file  ? Number of children: 1  ? Years of education: Not on file  ? Highest education level: Not on file  ?Occupational History  ? 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 Smoking status: Never  ? Smokeless tobacco: Never  ?Vaping Use  ? Vaping Use: Never used  ?Substance and Sexual Activity  ? Alcohol use: Yes  ?  Alcohol/week: 0.0 standard drinks  ?  Comment: RARE  ? Drug use: No  ? Sexual activity: Yes  ?  Birth control/protection: Post-menopausal  ?Other Topics Concern  ? Not on file  ?Social History Narrative  ? Not on file  ? ?Social Determinants of Health  ? ?Financial Resource Strain: Not on file  ?Food Insecurity: Not on file  ?Transportation Needs: Not on file  ?Physical Activity: Not on file  ?Stress: Not on file  ?Social Connections: Not on file  ?Intimate Partner Violence: Not on file  ? ? ?Review of Systems:    ?Constitutional: No weight loss, fever or chills ?Cardiovascular: No chest pain  ?Respiratory: No SOB ?Gastrointestinal: See HPI and otherwise negative ? ? ? Physical Exam:  ?Vital signs: ?BP 104/64   Pulse 76   Ht 5' 4.5" (1.638 m)   Wt 144 lb 9.6 oz (65.6 kg)   BMI 24.44 kg/m?   ? ?Constitutional:   Pleasant Caucasian female appears to be in NAD, Well developed, Well nourished, alert and cooperative ?Respiratory: Respirations even and unlabored. Lungs clear to auscultation bilaterally.   No wheezes, crackles, or rhonchi.  ?Cardiovascular: Normal S1, S2. No MRG. Regular rate and rhythm. No peripheral edema, cyanosis or pallor.  ?Gastrointestinal:   Soft, nondistended, nontender. No rebound or guarding. Normal bowel sounds. No appreciable masses or hepatomegaly. ?Psychiatric: Oriented to person, place and time. Demonstrates good judgement and reason wit

## 2022-01-03 NOTE — Patient Instructions (Signed)
Continue Pepcid '40mg'$  - daily  ? ?Increase your Pantoprazole to '40mg'$  - twice daily. ? ? ?You have been scheduled for an endoscopy. Please follow written instructions given to you at your visit today. ?If you use inhalers (even only as needed), please bring them with you on the day of your procedure. ? ?Thank you for choosing me and [facility]. ? ?Ellouise Newer PA  ? ?

## 2022-01-10 ENCOUNTER — Ambulatory Visit
Admission: RE | Admit: 2022-01-10 | Discharge: 2022-01-10 | Disposition: A | Discharge status: Home / Self Care | Payer: Medicare PPO | Source: Ambulatory Visit | Attending: Nurse Practitioner | Admitting: Nurse Practitioner

## 2022-01-10 ENCOUNTER — Encounter: Payer: Medicare PPO | Admitting: Gastroenterology

## 2022-01-10 DIAGNOSIS — N959 Unspecified menopausal and perimenopausal disorder: Secondary | ICD-10-CM

## 2022-01-19 ENCOUNTER — Ambulatory Visit: Payer: BC Managed Care – PPO | Admitting: Gastroenterology

## 2022-01-26 ENCOUNTER — Ambulatory Visit (AMBULATORY_SURGERY_CENTER): Payer: Medicare PPO | Admitting: Gastroenterology

## 2022-01-26 ENCOUNTER — Encounter: Payer: Self-pay | Admitting: Gastroenterology

## 2022-01-26 VITALS — BP 137/71 | HR 63 | Temp 98.0°F | Resp 13 | Ht 64.0 in | Wt 144.0 lb

## 2022-01-26 DIAGNOSIS — K209 Esophagitis, unspecified without bleeding: Secondary | ICD-10-CM

## 2022-01-26 DIAGNOSIS — K219 Gastro-esophageal reflux disease without esophagitis: Secondary | ICD-10-CM | POA: Diagnosis not present

## 2022-01-26 DIAGNOSIS — K449 Diaphragmatic hernia without obstruction or gangrene: Secondary | ICD-10-CM

## 2022-01-26 DIAGNOSIS — R053 Chronic cough: Secondary | ICD-10-CM

## 2022-01-26 DIAGNOSIS — R1310 Dysphagia, unspecified: Secondary | ICD-10-CM

## 2022-01-26 DIAGNOSIS — R933 Abnormal findings on diagnostic imaging of other parts of digestive tract: Secondary | ICD-10-CM

## 2022-01-26 MED ORDER — SODIUM CHLORIDE 0.9 % IV SOLN: 500.0000 mL | Freq: Once | INTRAVENOUS | Status: DC

## 2022-01-26 NOTE — Progress Notes (Signed)
Pt's states no medical or surgical changes since previsit or office visit. 

## 2022-01-26 NOTE — Progress Notes (Signed)
Pt in recovery with monitors in place, VSS. Report given to receiving RN. Bite guard was placed with pt awake to ensure comfort. No dental or soft tissue damage noted. 

## 2022-01-26 NOTE — Patient Instructions (Addendum)
Handouts Provided:  Post Dilation Diet  YOU HAD AN ENDOSCOPIC PROCEDURE TODAY AT [facility]:   Refer to the procedure report that was given to you for any specific questions about what was found during the examination.  If the procedure report does not answer your questions, please call your gastroenterologist to clarify.  If you requested that your care partner not be given the details of your procedure findings, then the procedure report has been included in a sealed envelope for you to review at your convenience later.  YOU SHOULD EXPECT: Some feelings of bloating in the abdomen. Passage of more gas than usual.  Walking can help get rid of the air that was put into your GI tract during the procedure and reduce the bloating. If you had a lower endoscopy (such as a colonoscopy or flexible sigmoidoscopy) you may notice spotting of blood in your stool or on the toilet paper. If you underwent a bowel prep for your procedure, you may not have a normal bowel movement for a few days.  Please Note:  You might notice some irritation and congestion in your nose or some drainage.  This is from the oxygen used during your procedure.  There is no need for concern and it should clear up in a day or so.  SYMPTOMS TO REPORT IMMEDIATELY:  Following upper endoscopy (EGD)  Vomiting of blood or coffee ground material  New chest pain or pain under the shoulder blades  Painful or persistently difficult swallowing  New shortness of breath  Fever of 100F or higher  Black, tarry-looking stools  For urgent or emergent issues, a gastroenterologist can be reached at any hour by calling [phone]. Do not use MyChart messaging for urgent concerns.    DIET:  See Post Dilation diet provided.  Drink plenty of fluids but you should avoid alcoholic beverages for 24 hours.  ACTIVITY:  You should plan to take it easy for the rest of today and you should NOT DRIVE or use heavy machinery until tomorrow  (because of the sedation medicines used during the test).    FOLLOW UP: Our staff will call the number listed on your records 48-72 hours following your procedure to check on you and address any questions or concerns that you may have regarding the information given to you following your procedure. If we do not reach you, we will leave a message.  We will attempt to reach you two times.  During this call, we will ask if you have developed any symptoms of COVID 19. If you develop any symptoms (ie: fever, flu-like symptoms, shortness of breath, cough etc.) before then, please call [phone].  If you test positive for Covid 19 in the 2 weeks post procedure, please call and report this information to [place].    If any biopsies were taken you will be contacted by phone or by letter within the next 1-3 weeks.  Please call us at [phone] if you have not heard about the biopsies in 3 weeks.    SIGNATURES/CONFIDENTIALITY: You and/or your care partner have signed paperwork which will be entered into your electronic medical record.  These signatures attest to the fact that that the information above on your After Visit Summary has been reviewed and is understood.  Full responsibility of the confidentiality of this discharge information lies with you and/or your care-partner.

## 2022-01-26 NOTE — Op Note (Signed)
[facility] Patient Name: Amanda Anthony Procedure Date: 01/26/2022 10:19 AM MRN: [id number] Endoscopist: Ladene Artist , MD Age: 66 Referring MD:  Date of Birth: [birth year] Gender: Female Account #: [id number] Procedure:                Upper GI endoscopy Indications:              Dysphagia, Abnormal esophagram, Chronic cough Medicines:                Monitored Anesthesia Care Procedure:                Pre-Anesthesia Assessment:                           - Prior to the procedure, a History and Physical                            was performed, and patient medications and                            allergies were reviewed. The patient's tolerance of                            previous anesthesia was also reviewed. The risks                            and benefits of the procedure and the sedation                            options and risks were discussed with the patient.                            All questions were answered, and informed consent                            was obtained. Prior Anticoagulants: The patient has                            taken no previous anticoagulant or antiplatelet                            agents. ASA Grade Assessment: II - A patient with                            mild systemic disease. After reviewing the risks                            and benefits, the patient was deemed in                            satisfactory condition to undergo the procedure.                           After obtaining informed consent, the endoscope was  passed under direct vision. Throughout the                            procedure, the patient's blood pressure, pulse, and                            oxygen saturations were monitored continuously. The                            Endoscope was introduced through the mouth, and                            advanced to the second part of duodenum. The upper                            GI  endoscopy was accomplished without difficulty.                            The patient tolerated the procedure well. Scope In: Scope Out: Findings:                 Non-severe esophagitis with no bleeding was found                            in the distal esophagus. Biopsies were taken with a                            cold forceps for histology.                           No endoscopic abnormality was evident in the                            esophagus to explain the patient's complaint of                            dysphagia. It was decided, however, to proceed with                            dilation of the entire esophagus. A guidewire was                            placed and the scope was withdrawn. Dilation was                            performed with a Savary dilator with no resistance                            at 17 mm.                           A small hiatal hernia was present.  The exam of the stomach was otherwise normal.                           The duodenal bulb and second portion of the                            duodenum were normal. Complications:            No immediate complications. Estimated Blood Loss:     Estimated blood loss was minimal. Impression:               - Mild reflux esophagitis with no bleeding.                            Biopsied.                           - No endoscopic esophageal abnormality to explain                            patient's dysphagia. Esophagus dilated.                           - Small hiatal hernia.                           - Normal duodenal bulb and second portion of the                            duodenum. Recommendation:           - Patient has a contact number available for                            emergencies. The signs and symptoms of potential                            delayed complications were discussed with the                            patient. Return to normal activities tomorrow.                             Written discharge instructions were provided to the                            patient.                           - Clear liquid diet for 2 hours, then advance as                            tolerated to soft diet today.                           - Resume previous diet tomorrow.                           -  Follow antireflux measures.                           - Continue present medications.                           - Schedule MBSS for cough with swallowing.                           - PCP to consider discontinue ACE inhibitor for                            cough.                           - Await pathology results. Ladene Artist, MD 01/26/2022 10:51:08 AM This report has been signed electronically.

## 2022-01-26 NOTE — Progress Notes (Signed)
See 01/03/2022 H&P, no changes.

## 2022-01-26 NOTE — Progress Notes (Signed)
Called to room to assist during endoscopic procedure.  Patient ID and intended procedure confirmed with present staff. Received instructions for my participation in the procedure from the performing physician.  

## 2022-01-27 ENCOUNTER — Telehealth: Payer: Self-pay | Admitting: *Deleted

## 2022-01-27 ENCOUNTER — Telehealth: Payer: Self-pay

## 2022-01-27 ENCOUNTER — Other Ambulatory Visit: Payer: Self-pay

## 2022-01-27 ENCOUNTER — Other Ambulatory Visit (HOSPITAL_COMMUNITY): Payer: Self-pay | Admitting: *Deleted

## 2022-01-27 DIAGNOSIS — R1310 Dysphagia, unspecified: Secondary | ICD-10-CM

## 2022-01-27 DIAGNOSIS — R059 Cough, unspecified: Secondary | ICD-10-CM

## 2022-01-27 NOTE — Telephone Encounter (Signed)
Patient has been scheduled for MBSS at [facility] on 02/04/22 at 1:00 pm. Patient has been notified.

## 2022-01-27 NOTE — Telephone Encounter (Signed)
  Follow up Call-     01/26/2022    9:57 AM 01/22/2021    1:10 PM  Call back number  Post procedure Call Back phone  # [phone]  Permission to leave phone message Yes Yes     Patient questions:  Do you have a fever, pain , or abdominal swelling? No. Pain Score  0 *  Have you tolerated food without any problems? Yes.    Have you been able to return to your normal activities? Yes.    Do you have any questions about your discharge instructions: Diet   No. Medications  No. Follow up visit  No.  Do you have questions or concerns about your Care? No.  Actions: * If pain score is 4 or above: No action needed, pain <4.

## 2022-02-04 ENCOUNTER — Ambulatory Visit (HOSPITAL_COMMUNITY)
Admission: RE | Admit: 2022-02-04 | Discharge: 2022-02-04 | Disposition: A | Discharge status: Home / Self Care | Payer: Medicare PPO | Source: Ambulatory Visit | Attending: Gastroenterology | Admitting: Gastroenterology

## 2022-02-04 ENCOUNTER — Encounter: Payer: Self-pay | Admitting: Gastroenterology

## 2022-02-04 DIAGNOSIS — R1310 Dysphagia, unspecified: Secondary | ICD-10-CM | POA: Diagnosis not present

## 2022-02-04 DIAGNOSIS — R053 Chronic cough: Secondary | ICD-10-CM | POA: Diagnosis not present

## 2022-02-04 DIAGNOSIS — K2100 Gastro-esophageal reflux disease with esophagitis, without bleeding: Secondary | ICD-10-CM | POA: Diagnosis not present

## 2022-02-04 DIAGNOSIS — R948 Abnormal results of function studies of other organs and systems: Secondary | ICD-10-CM | POA: Insufficient documentation

## 2022-02-04 DIAGNOSIS — K449 Diaphragmatic hernia without obstruction or gangrene: Secondary | ICD-10-CM | POA: Insufficient documentation

## 2022-02-04 DIAGNOSIS — Z79899 Other long term (current) drug therapy: Secondary | ICD-10-CM | POA: Diagnosis not present

## 2022-02-04 DIAGNOSIS — R059 Cough, unspecified: Secondary | ICD-10-CM

## 2022-02-04 NOTE — Progress Notes (Signed)
Modified Barium Swallow Progress Note  Patient Details  Name: Amanda Anthony MRN: [id number] Date of Birth: November 01, 1955  Today's Date: 02/04/2022  Modified Barium Swallow completed.  Full report located under Chart Review in the Imaging Section.  Brief recommendations include the following:  Clinical Impression  Pt able to manipulate masticate and propel various consistencies to posterior oral cavity timely and effectively. Mechanics of pharyngeal swallow were intact without laryngeal penetration or aspiration. Appreciated normal laryngeal elevation and epiglottic deflection to protect airway, No residue. MBS does not diagnose below the level of the UES however was scanned not revealing abnormality. The barium pill transited through pharynx and through esophagus without difficulty. Suspect her symptoms may originate from her known GER, recently diagnosed esophagitis and hiatal hernia. Provided education re: strategies to mitigate esophageal involvement.   Swallow Evaluation Recommendations       SLP Diet Recommendations: Regular solids;Thin liquid   Liquid Administration via: Straw;Cup   Medication Administration: Whole meds with liquid   Supervision: Patient able to self feed   Compensations: Follow solids with liquid   Postural Changes: Seated upright at 90 degrees;Remain semi-upright after after feeds/meals (Comment)   Oral Care Recommendations: Oral care BID        Houston Siren 02/04/2022,2:05 PM

## 2022-06-13 ENCOUNTER — Other Ambulatory Visit: Payer: Self-pay | Admitting: Obstetrics

## 2022-06-13 DIAGNOSIS — Z1231 Encounter for screening mammogram for malignant neoplasm of breast: Secondary | ICD-10-CM

## 2022-07-15 ENCOUNTER — Ambulatory Visit: Payer: Medicare PPO

## 2022-10-06 ENCOUNTER — Ambulatory Visit
Admission: RE | Admit: 2022-10-06 | Discharge: 2022-10-06 | Disposition: A | Discharge status: Home / Self Care | Payer: Medicare PPO | Source: Ambulatory Visit | Attending: Obstetrics | Admitting: Obstetrics

## 2022-10-06 DIAGNOSIS — Z1231 Encounter for screening mammogram for malignant neoplasm of breast: Secondary | ICD-10-CM

## 2022-11-14 IMAGING — RF DG SWALLOWING FUNCTION
11 series · 19 of 24 positions shown · non-contrast
Comparison: None Available.

CLINICAL DATA: Dysphagia.

EXAM:
MODIFIED BARIUM SWALLOW
TECHNIQUE: Different consistencies of barium were administered orally to the
patient by the Speech Pathologist. Imaging of the pharynx was
performed in the lateral projection by Seifu Haji Yuye, PA. The
radiologist provided supervision.
FLUOROSCOPY:
Radiation Exposure Index (as provided by the fluoroscopic device):
7.1 mGy Kerma

[Series 1: cp_standard · 2 of 22 frames shown (1 of 11)]
[frame 1/22]
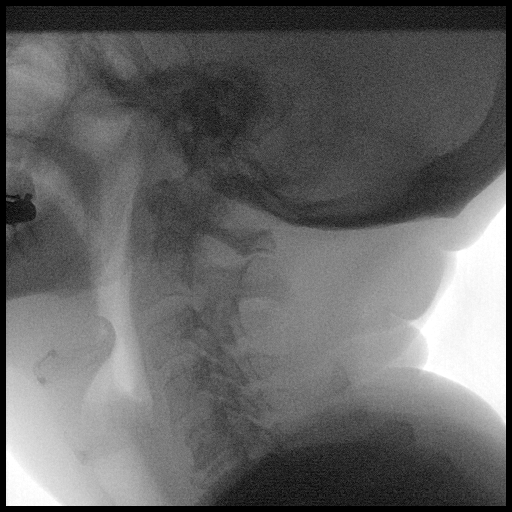
[frame 12/22]
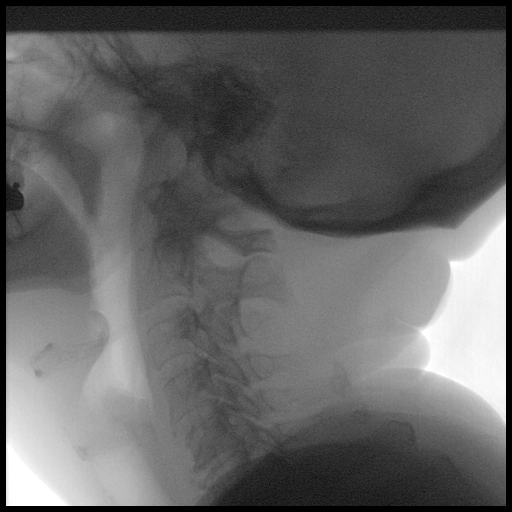

[Series 2: cp_standard · 2 of 18 frames shown (2 of 11)]
[frame 13/18]
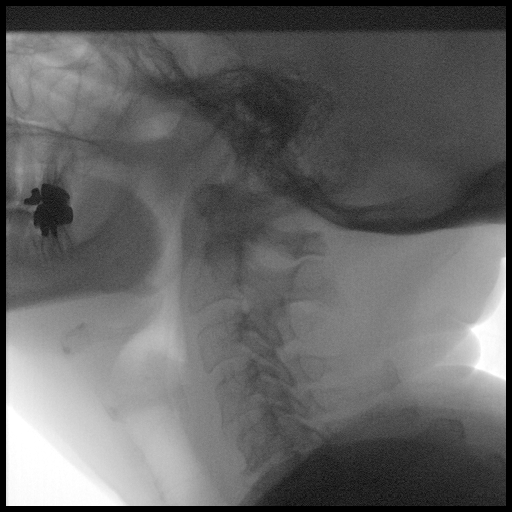
[frame 16/18]
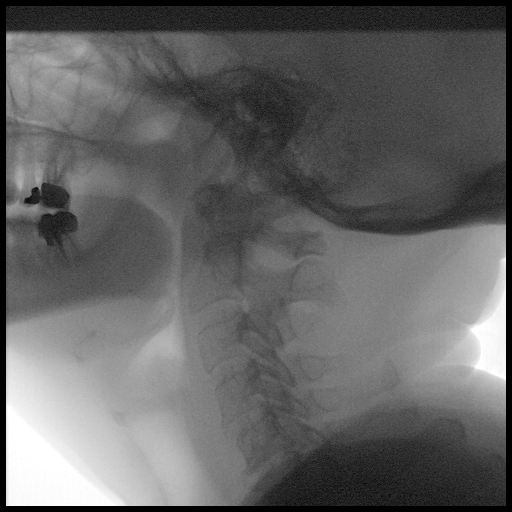

[Series 3: cp_standard · 2 of 16 frames shown (3 of 11)]
[frame 9/16]
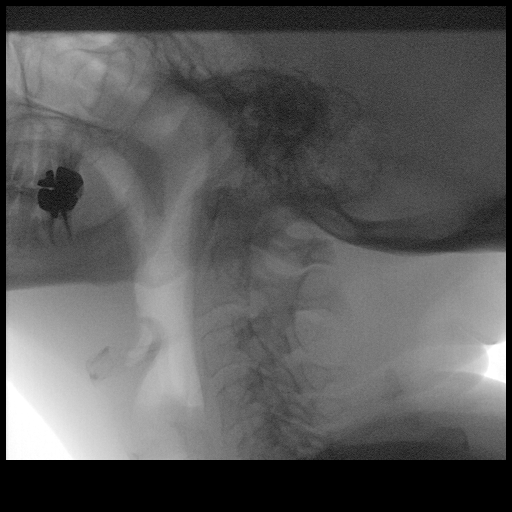
[frame 14/16]
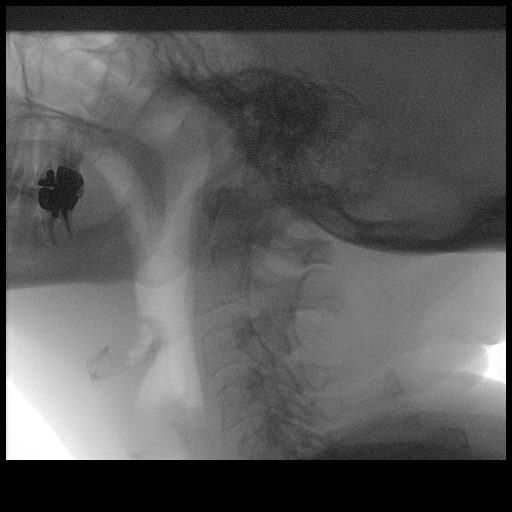

[Series 4: cp_standard · 1 of 143 frames shown (4 of 11)]
[frame 122/143]
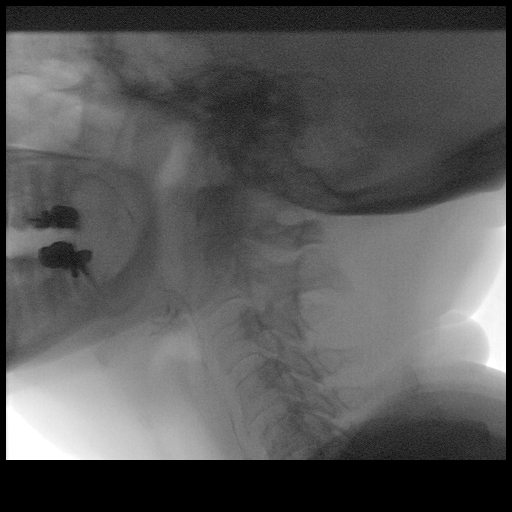

[Series 5: cp_standard · 2 of 129 frames shown (5 of 11)]
[frame 65/129]
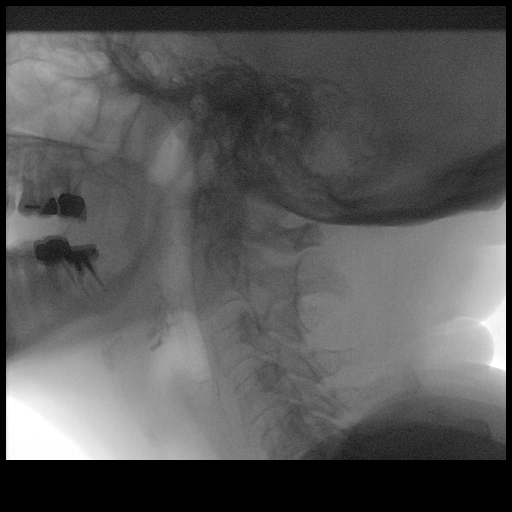
[frame 124/129]
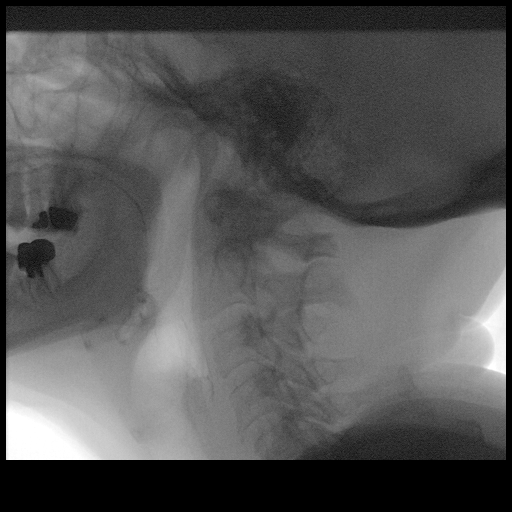

[Series 6: cp_standard · 1 of 402 frames shown (6 of 11)]
[frame 202/402]
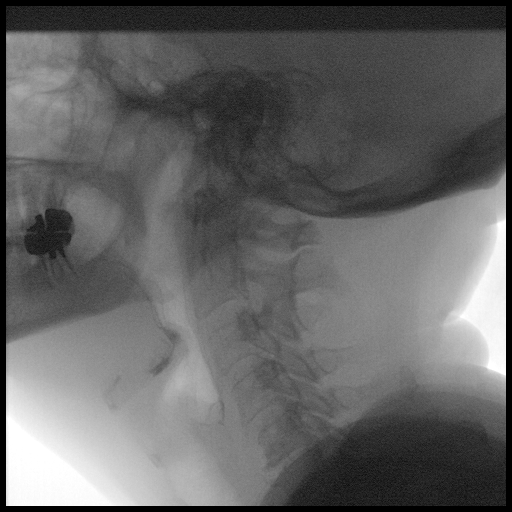

[Series 7: cp_standard · 2 of 120 frames shown (7 of 11)]
[frame 19/120]
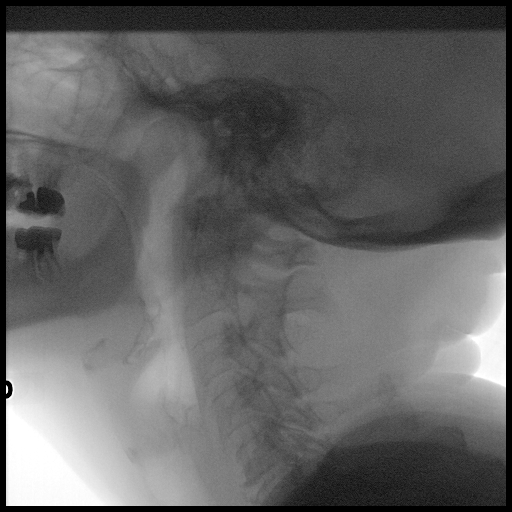
[frame 103/120]
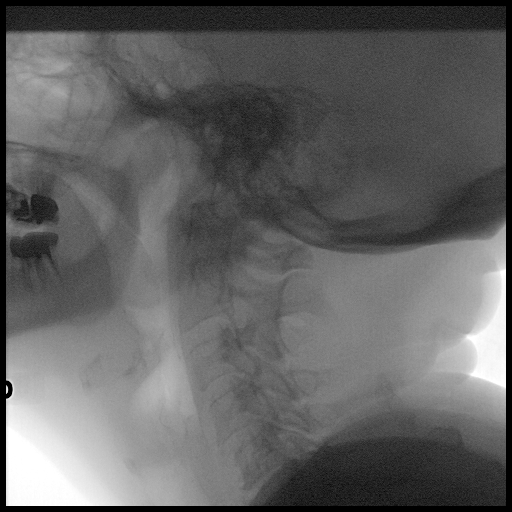

[Series 8: cp_standard · 1 of 167 frames shown (8 of 11)]
[frame 26/167]
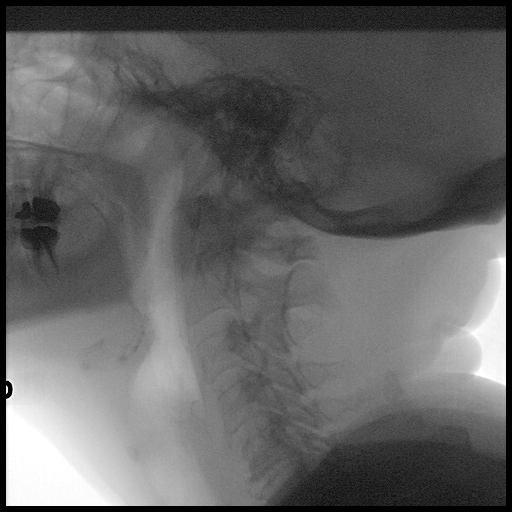

[Series 9: cp_standard · 2 of 110 frames shown (9 of 11)]
[frame 17/110]
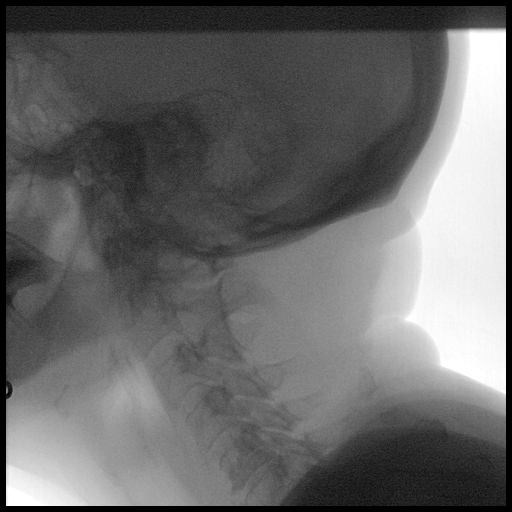
[frame 66/110]
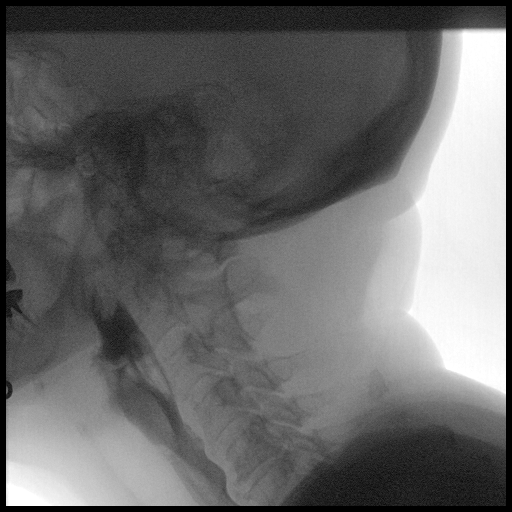

[Series 10: cp_standard · 2 of 245 frames shown (10 of 11)]
[frame 37/245]
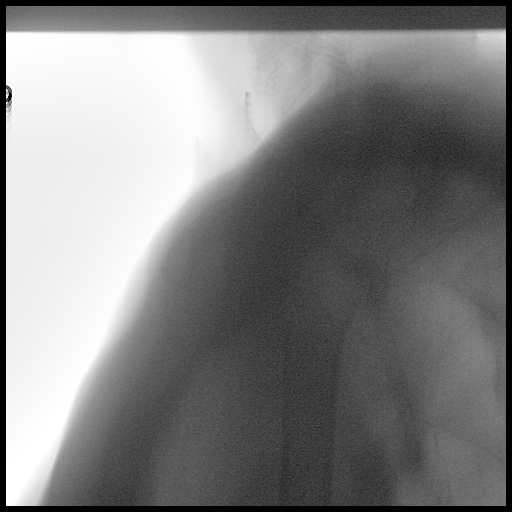
[frame 123/245]
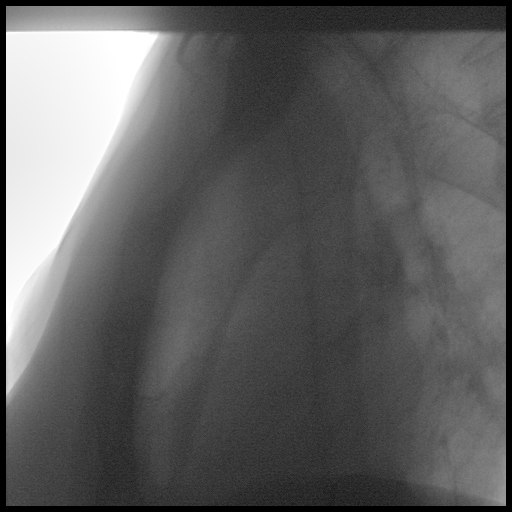

[Series 11: cp_standard · 2 of 19 frames shown (11 of 11)]
[frame 10/19]
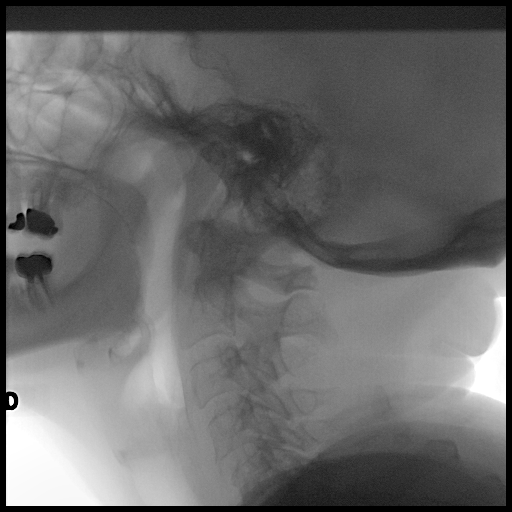
[frame 17/19]
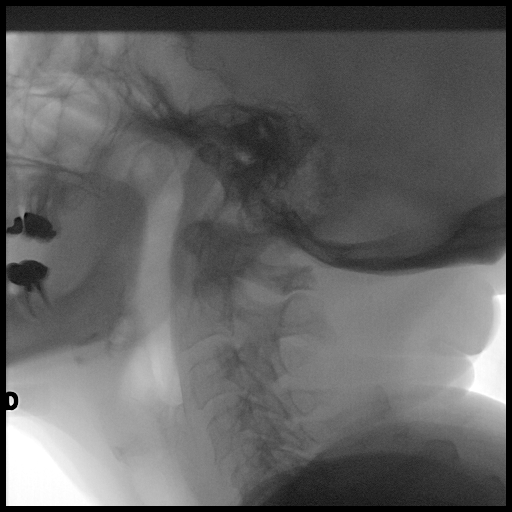

[19 of 24 positions shown; findings below may reference images not displayed]

FINDINGS: Vestibular  Penetration:  None seen.

Aspiration:  None seen.

Other:  13 mm barium tablet passed without difficulty.
IMPRESSION: No aspiration with attempted consistencies including thin liquids.

Please refer to the Speech Pathologists report for complete details
and recommendations.

## 2023-10-02 ENCOUNTER — Other Ambulatory Visit: Payer: Self-pay | Admitting: Obstetrics

## 2023-10-02 DIAGNOSIS — Z1231 Encounter for screening mammogram for malignant neoplasm of breast: Secondary | ICD-10-CM

## 2023-10-11 DIAGNOSIS — L578 Other skin changes due to chronic exposure to nonionizing radiation: Secondary | ICD-10-CM | POA: Diagnosis not present

## 2023-10-11 DIAGNOSIS — D225 Melanocytic nevi of trunk: Secondary | ICD-10-CM | POA: Diagnosis not present

## 2023-10-11 DIAGNOSIS — L821 Other seborrheic keratosis: Secondary | ICD-10-CM | POA: Diagnosis not present

## 2023-10-11 DIAGNOSIS — D2239 Melanocytic nevi of other parts of face: Secondary | ICD-10-CM | POA: Diagnosis not present

## 2023-10-13 DIAGNOSIS — E785 Hyperlipidemia, unspecified: Secondary | ICD-10-CM | POA: Diagnosis not present

## 2023-10-13 DIAGNOSIS — E1169 Type 2 diabetes mellitus with other specified complication: Secondary | ICD-10-CM | POA: Diagnosis not present

## 2023-10-16 ENCOUNTER — Ambulatory Visit: Payer: Medicare PPO

## 2023-10-16 ENCOUNTER — Ambulatory Visit
Admission: RE | Admit: 2023-10-16 | Discharge: 2023-10-16 | Disposition: A | Discharge status: Home / Self Care | Payer: HMO | Source: Ambulatory Visit | Attending: Obstetrics | Admitting: Obstetrics

## 2023-10-16 DIAGNOSIS — Z1231 Encounter for screening mammogram for malignant neoplasm of breast: Secondary | ICD-10-CM

## 2023-10-16 DIAGNOSIS — Z124 Encounter for screening for malignant neoplasm of cervix: Secondary | ICD-10-CM | POA: Diagnosis not present

## 2023-10-16 DIAGNOSIS — Z01419 Encounter for gynecological examination (general) (routine) without abnormal findings: Secondary | ICD-10-CM | POA: Diagnosis not present

## 2023-10-16 DIAGNOSIS — Z1331 Encounter for screening for depression: Secondary | ICD-10-CM | POA: Diagnosis not present

## 2023-10-16 DIAGNOSIS — N130 Hydronephrosis with ureteropelvic junction obstruction: Secondary | ICD-10-CM | POA: Diagnosis not present

## 2023-10-16 DIAGNOSIS — Z01411 Encounter for gynecological examination (general) (routine) with abnormal findings: Secondary | ICD-10-CM | POA: Diagnosis not present

## 2023-10-16 DIAGNOSIS — N3000 Acute cystitis without hematuria: Secondary | ICD-10-CM | POA: Diagnosis not present

## 2023-10-17 ENCOUNTER — Other Ambulatory Visit: Payer: Self-pay | Admitting: Obstetrics

## 2023-10-17 DIAGNOSIS — M810 Age-related osteoporosis without current pathological fracture: Secondary | ICD-10-CM

## 2023-11-20 DIAGNOSIS — E1140 Type 2 diabetes mellitus with diabetic neuropathy, unspecified: Secondary | ICD-10-CM | POA: Diagnosis not present

## 2023-11-20 DIAGNOSIS — Z760 Encounter for issue of repeat prescription: Secondary | ICD-10-CM | POA: Diagnosis not present

## 2023-11-20 DIAGNOSIS — F419 Anxiety disorder, unspecified: Secondary | ICD-10-CM | POA: Diagnosis not present

## 2023-11-20 DIAGNOSIS — N1831 Chronic kidney disease, stage 3a: Secondary | ICD-10-CM | POA: Diagnosis not present

## 2023-11-20 DIAGNOSIS — F32A Depression, unspecified: Secondary | ICD-10-CM | POA: Diagnosis not present

## 2023-11-20 DIAGNOSIS — E1122 Type 2 diabetes mellitus with diabetic chronic kidney disease: Secondary | ICD-10-CM | POA: Diagnosis not present

## 2023-11-20 DIAGNOSIS — N3281 Overactive bladder: Secondary | ICD-10-CM | POA: Diagnosis not present

## 2023-11-20 DIAGNOSIS — E785 Hyperlipidemia, unspecified: Secondary | ICD-10-CM | POA: Diagnosis not present

## 2023-11-20 DIAGNOSIS — Z6823 Body mass index (BMI) 23.0-23.9, adult: Secondary | ICD-10-CM | POA: Diagnosis not present

## 2023-11-20 DIAGNOSIS — Z800 Family history of malignant neoplasm of digestive organs: Secondary | ICD-10-CM | POA: Diagnosis not present

## 2023-11-23 ENCOUNTER — Ambulatory Visit (AMBULATORY_SURGERY_CENTER)

## 2023-11-23 VITALS — Ht 64.0 in | Wt 140.0 lb

## 2023-11-23 DIAGNOSIS — Z800 Family history of malignant neoplasm of digestive organs: Secondary | ICD-10-CM

## 2023-11-23 DIAGNOSIS — Z860100 Personal history of colon polyps, unspecified: Secondary | ICD-10-CM

## 2023-11-23 MED ORDER — SUTAB 1479-225-188 MG PO TABS: ORAL_TABLET | ORAL | 0 refills | Status: DC

## 2023-11-23 NOTE — Progress Notes (Signed)
 No egg or soy allergy known to patient  No issues known to pt with past sedation with any surgeries or procedures Patient denies ever being told they had issues or difficulty with intubation  No FH of Malignant Hyperthermia Pt is not on diet pills Pt is not on  home 02  Pt is not on blood thinners  Pt denies issues with constipation  No A fib or A flutter Have any cardiac testing pending--no Ambulates independently

## 2023-11-29 ENCOUNTER — Encounter (HOSPITAL_BASED_OUTPATIENT_CLINIC_OR_DEPARTMENT_OTHER): Payer: Self-pay | Admitting: Emergency Medicine

## 2023-11-29 ENCOUNTER — Ambulatory Visit (HOSPITAL_BASED_OUTPATIENT_CLINIC_OR_DEPARTMENT_OTHER)
Admission: EM | Admit: 2023-11-29 | Discharge: 2023-11-29 | Disposition: A | Discharge status: Home / Self Care | Attending: Family Medicine | Admitting: Family Medicine

## 2023-11-29 DIAGNOSIS — J0140 Acute pansinusitis, unspecified: Secondary | ICD-10-CM | POA: Diagnosis not present

## 2023-11-29 DIAGNOSIS — J3489 Other specified disorders of nose and nasal sinuses: Secondary | ICD-10-CM | POA: Diagnosis not present

## 2023-11-29 MED ORDER — DOXYCYCLINE HYCLATE 100 MG PO CAPS: 100.0000 mg | ORAL_CAPSULE | Freq: Two times a day (BID) | ORAL | 0 refills | Status: DC

## 2023-11-29 NOTE — ED Triage Notes (Signed)
 Pt c/o sinus congestion, headache, neck hurts, sore throat started Sunday. Pt has taken allergy medication this morning and it did relieve some pressure

## 2023-11-29 NOTE — Discharge Instructions (Addendum)
 Will treat for sinusitis.  Encouraged sinus rinses.  Doxycycline 100 mg twice daily for 10 days.  Encouraged fluticasone nasal spray, 1 spray into each nostril, once daily to open up sinuses.  This is an over-the-counter medication.  Get plenty of fluids and rest.  Follow-up if symptoms do not improve, worsen or new symptoms occur.

## 2023-11-29 NOTE — ED Provider Notes (Signed)
 [facility]    CSN: [id number] Arrival date & time: 11/29/23  1623      History   Chief Complaint No chief complaint on file.   HPI Amanda Anthony is a 68 y.o. female.   Patient reports head congestion, sinus pressure and pain, headache, sore throat.  Symptoms started early Sunday, 11/26/2023.  She feels like this is a sinus infection.  She is not getting much mucus out but it is kind of a green-brown.  She has taken some OTC allergy medicine and that helped her symptoms a little.     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   Diabetes mellitus    DIABETES MELLITUS, GESTATIONAL, HX OF 11/13/2007   Qualifier: Diagnosis of  By: Pasty Arch LPN, Regina    Overview:  Overview:  Qualifier: Diagnosis of  By: Pasty Arch LPN, Regina    GERD (gastroesophageal reflux disease)    HEMATOCHEZIA 11/13/2007   Qualifier: Diagnosis of  By: Pasty Arch LPN, Regina     Hematochezia 11/13/2007   Overview:  Overview:  Qualifier: Diagnosis of  By: Pasty Arch LPN, Regina    Hemorrhoids, internal 11/13/2007   history of with pregnancy Qualifier: Diagnosis of  By: Pasty Arch LPN, Regina    Overview:  Overview:  Qualifier: Diagnosis of  By: Pasty Arch LPN, Regina    History of kidney stones    Hx of chest pain 11/15/2017   Hypercholesteremia 04/17/2017   Hypertension 11/13/2007   pt not tretaed for HTN , patient unawareQualifier: Diagnosis of  By: Pasty Arch LPN, Regina    Overview:  Overview:  Qualifier: Diagnosis of  By: Laws LPN, Regina -    Insomnia    Neuromuscular disorder (HCC)    neuropathy    Neuropathy 05/17/2017   Osteopenia    PONV (postoperative nausea and vomiting)    Rash of groin 03/14/2017   Residual hemorrhoidal skin tags 11/13/2007   Qualifier:  Diagnosis of  By: Pasty Arch LPN, Regina    Overview:  Overview:  Qualifier: Diagnosis of  By: Laws LPN, Regina    Tubular adenoma of colon 04/2015   UTI (urinary tract infection)    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   Vitamin B 12 deficiency 04/18/2017   Vitamin D deficiency     Patient Active Problem List   Diagnosis Date Noted   Chest pain in adult 11/15/2017   Chronic pain of both knees 11/12/2017   Neuropathy 05/17/2017   Diabetes 1.5, 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   Vitamin B 12 deficiency 04/18/2017   Hypercholesteremia 04/17/2017   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   Rash of groin 03/14/2017   Anxiety 03/13/2017   Depression 03/13/2017   Hypertension 11/13/2007   Hemorrhoids, internal 11/13/2007   Residual hemorrhoidal skin tags 11/13/2007   HEMATOCHEZIA 11/13/2007   ABDOMINAL PAIN, LEFT LOWER QUADRANT 11/13/2007   DIABETES MELLITUS, GESTATIONAL, HX OF 11/13/2007   Hematochezia 11/13/2007    Past Surgical History:  Procedure Laterality Date   APPENDECTOMY     COLONOSCOPY  2011   hyperplastic polyps   COLONOSCOPY  01/22/2021   MS   CYSTOSCOPY WITH RETROGRADE PYELOGRAM, URETEROSCOPY AND STENT PLACEMENT Left 06/11/2020   Procedure:  CYSTOSCOPY WITH RETROGRADE PYELOGRAM, URETEROSCOPY AND STENT PLACEMENT;  Surgeon: Crist Fat, MD;  Location: [facility];  Service: Urology;  Laterality: Left;  75 MINS   CYSTOSCOPY/URETEROSCOPY/HOLMIUM LASER/STENT PLACEMENT Left 03/27/2019   Procedure: CYSTOSCOPY LEFT URETEROSCOPY/HOLMIUM LASER/STENT EXCHANGE;  Surgeon: Crist Fat, MD;  Location: [facility];  Service: Urology;  Laterality: Left;   ENDOMETRIAL CRYOABLATION     laser to removed the endometriosis   HOLMIUM LASER APPLICATION Left 06/11/2020   Procedure: HOLMIUM LASER APPLICATION;  Surgeon: Crist Fat, MD;  Location: [facility];  Service: Urology;  Laterality: Left;   KIDNEY STONE SURGERY  03/18/2019   POLYPECTOMY     TONSILLECTOMY AND ADENOIDECTOMY     UPPER  GASTROINTESTINAL ENDOSCOPY     UPPER GI ENDOSCOPY      OB History   No obstetric history on file.      Home Medications    Prior to Admission medications   Medication Sig Start Date End Date Taking? Authorizing Provider  Cholecalciferol (VITAMIN D-3) 125 MCG (5000 UT) TABS Take 5,000 Units by mouth at bedtime.   Yes [provider]  doxycycline (VIBRAMYCIN) 100 MG capsule Take 1 capsule (100 mg total) by mouth 2 (two) times daily. 11/29/23  Yes Prescilla Sours, FNP  escitalopram (LEXAPRO) 20 MG tablet Take 1 tablet by mouth daily.   Yes [provider]  famotidine (PEPCID) 40 MG tablet Take 40 mg by mouth daily. 12/03/20  Yes [provider]  metFORMIN (GLUCOPHAGE-XR) 500 MG 24 hr tablet Take 500 mg by mouth at bedtime.    Yes [provider]  pravastatin (PRAVACHOL) 40 MG tablet Take 40 mg by mouth at bedtime.  10/09/17  Yes [provider]  tirzepatide [place] Keen) 10 MG/0.5ML Pen Inject 10 mg into the skin once a week.   Yes [provider]  Accu-Chek Softclix Lancets lancets USE 1 to check blood glucose daily    [provider]  lisinopril (ZESTRIL) 2.5 MG tablet Take 2.5 mg by mouth daily. 10/12/21   [provider]  MYRBETRIQ 50 MG TB24 tablet Take 50 mg by mouth at bedtime.  06/03/20   [provider]  pantoprazole (PROTONIX) 40 MG tablet Take 1 tablet (40 mg total) by mouth 2 (two) times daily. Patient taking differently: Take 40 mg by mouth at bedtime as needed (heartburn). 11/10/15   Meryl Dare, MD  Sodium Sulfate-Mag Sulfate-KCl (SUTAB) [phone] MG TABS Use as directed for colonoscopy. MANUFACTURER CODES!! BIN: [id number] PCN: CN GROUP: [id number] MEMBER ID: [id number];[employer] AS SECONDARY INSURANCE ;NO PRIOR AUTHORIZATION 11/23/23   Jenel Lucks, MD  tirzepatide [place]) 10 MG/0.5ML Pen Inject 10 mg into the skin once a week.    [provider]  traZODone (DESYREL) 100 MG tablet Take 100 mg  by mouth at bedtime as needed for sleep.     [provider]    Family History Family History  Problem Relation Age of Onset   Diverticulitis Father    Diabetes Father    Heart attack Father    Heart disease Mother    Heart attack Mother    Lung cancer Sister        died age 6-smoker    Colon polyps Sister    Colon cancer Maternal Aunt    Colon cancer Maternal Grandmother    Breast cancer Paternal Grandmother    Esophageal cancer Neg Hx    Rectal cancer Neg Hx    Stomach cancer Neg Hx  Social History Social History   Tobacco Use   Smoking status: Never   Smokeless tobacco: Never  Vaping Use   Vaping status: Never Used  Substance Use Topics   Alcohol use: Yes    Alcohol/week: 0.0 standard drinks of alcohol    Comment: RARE   Drug use: No     Allergies   Pregabalin, Other, and Zocor [simvastatin]   Review of Systems Review of Systems  Constitutional:  Negative for chills and fever.  HENT:  Positive for congestion, postnasal drip, rhinorrhea, sinus pressure, sinus pain and sore throat. Negative for ear pain.   Eyes:  Negative for pain and visual disturbance.  Respiratory:  Negative for cough and shortness of breath.   Cardiovascular:  Negative for chest pain and palpitations.  Gastrointestinal:  Negative for abdominal pain, constipation, diarrhea, nausea and vomiting.  Genitourinary:  Negative for dysuria and hematuria.  Musculoskeletal:  Negative for arthralgias and back pain.  Skin:  Negative for color change and rash.  Neurological:  Negative for seizures and syncope.  Hematological:  Positive for adenopathy (in her neck).  All other systems reviewed and are negative.    Physical Exam Triage Vital Signs ED Triage Vitals [11/29/23 1628]  Encounter Vitals Group     BP      Systolic BP Percentile      Diastolic BP Percentile      Pulse      Resp      Temp      Temp src      SpO2      Weight      Height      Head Circumference       Peak Flow      Pain Score 5     Pain Loc      Pain Education      Exclude from Growth Chart    No data found.  Updated Vital Signs BP 111/73 (BP Location: Right Arm)   Pulse 81   Temp 98.3 F (36.8 C) (Oral)   Resp 18   SpO2 98%   Visual Acuity Right Eye Distance:   Left Eye Distance:   Bilateral Distance:    Right Eye Near:   Left Eye Near:    Bilateral Near:     Physical Exam Vitals and nursing note reviewed.  Constitutional:      General: She is not in acute distress.    Appearance: She is well-developed. She is not ill-appearing or toxic-appearing.  HENT:     Head: Normocephalic and atraumatic.     Right Ear: Hearing, tympanic membrane, ear canal and external ear normal.     Left Ear: Hearing, tympanic membrane, ear canal and external ear normal.     Nose: Mucosal edema, congestion and rhinorrhea present. Rhinorrhea is purulent.     Right Sinus: Maxillary sinus tenderness and frontal sinus tenderness present.     Left Sinus: Maxillary sinus tenderness and frontal sinus tenderness present.     Mouth/Throat:     Lips: Pink.     Mouth: Mucous membranes are moist.     Pharynx: Uvula midline. No oropharyngeal exudate or posterior oropharyngeal erythema.     Tonsils: No tonsillar exudate.  Eyes:     Conjunctiva/sclera: Conjunctivae normal.     Pupils: Pupils are equal, round, and reactive to light.  Cardiovascular:     Rate and Rhythm: Normal rate and regular rhythm.     Heart sounds: S1 normal and S2 normal. No  murmur heard. Pulmonary:     Effort: Pulmonary effort is normal. No respiratory distress.     Breath sounds: Normal breath sounds. No decreased breath sounds, wheezing, rhonchi or rales.  Abdominal:     General: Bowel sounds are normal.     Palpations: Abdomen is soft.     Tenderness: There is no abdominal tenderness.  Musculoskeletal:        General: No swelling.     Cervical back: Neck supple.  Lymphadenopathy:     Head:     Right side of head:  Submandibular adenopathy present. No submental, tonsillar, preauricular or posterior auricular adenopathy.     Left side of head: Submandibular adenopathy present. No submental, tonsillar, preauricular or posterior auricular adenopathy.     Cervical: Cervical adenopathy present.     Right cervical: Superficial cervical adenopathy present.     Left cervical: Superficial cervical adenopathy present.  Skin:    General: Skin is warm and dry.     Capillary Refill: Capillary refill takes less than 2 seconds.     Findings: No rash.  Neurological:     Mental Status: She is alert and oriented to person, place, and time.  Psychiatric:        Mood and Affect: Mood normal.      UC Treatments / Results  Labs (all labs ordered are listed, but only abnormal results are displayed) Labs Reviewed - No data to display  EKG   Radiology No results found.  Procedures Procedures (including critical care time)  Medications Ordered in UC Medications - No data to display  Initial Impression / Assessment and Plan / UC Course  I have reviewed the triage vital signs and the nursing notes.  Pertinent labs & imaging results that were available during my care of the patient were reviewed by me and considered in my medical decision making (see chart for details).     Will treat for sinusitis with doxycycline, 100 mg twice daily for 10 days.  Encouraged sinus rinses.  Encouraged OTC fluticasone nasal spray, 1 spray into each nostril once daily for nasal congestion.  Get plenty of fluids and rest.  Follow-up if symptoms do not improve, worsen or new symptoms occur.  Patient felt like she was not going to use the fluticasone and she did not want injectable or oral steroids.  She intends just to take the antibiotic and do the sinus rinses and I am okay with that plan if that is her preference. Final Clinical Impressions(s) / UC Diagnoses   Final diagnoses:  Acute non-recurrent pansinusitis  Sinus pressure      Discharge Instructions      Will treat for sinusitis.  Encouraged sinus rinses.  Doxycycline 100 mg twice daily for 10 days.  Encouraged fluticasone nasal spray, 1 spray into each nostril, once daily to open up sinuses.  This is an over-the-counter medication.  Get plenty of fluids and rest.  Follow-up if symptoms do not improve, worsen or new symptoms occur.     ED Prescriptions     Medication Sig Dispense Auth. Provider   doxycycline (VIBRAMYCIN) 100 MG capsule Take 1 capsule (100 mg total) by mouth 2 (two) times daily. 20 capsule Prescilla Sours, FNP      PDMP not reviewed this encounter.   Prescilla Sours, FNP 11/29/23 [phone]

## 2023-11-30 ENCOUNTER — Telehealth: Payer: Self-pay | Admitting: Gastroenterology

## 2023-11-30 NOTE — Telephone Encounter (Signed)
 Reached out to patient and provided her with updated prep instruction times. Also advised patient to call back on Monday with questions or if her symptoms don't improve. Patient agreed to POC.

## 2023-11-30 NOTE — Telephone Encounter (Signed)
 Good morning Dr. Tomasa Rand I received a call from this patient  stating  that she was having cold and flu like symptoms and needed to reschedule her procedure. Patient was scheduled for April 1 at 8:30 am. Please advise.

## 2023-12-01 ENCOUNTER — Encounter: Admitting: Gastroenterology

## 2023-12-05 ENCOUNTER — Encounter: Admitting: Gastroenterology

## 2024-01-08 DIAGNOSIS — Z6822 Body mass index (BMI) 22.0-22.9, adult: Secondary | ICD-10-CM | POA: Diagnosis not present

## 2024-01-08 DIAGNOSIS — Z862 Personal history of diseases of the blood and blood-forming organs and certain disorders involving the immune mechanism: Secondary | ICD-10-CM | POA: Diagnosis not present

## 2024-01-08 DIAGNOSIS — E1122 Type 2 diabetes mellitus with diabetic chronic kidney disease: Secondary | ICD-10-CM | POA: Diagnosis not present

## 2024-01-08 DIAGNOSIS — E539 Vitamin B deficiency, unspecified: Secondary | ICD-10-CM | POA: Diagnosis not present

## 2024-01-08 DIAGNOSIS — R6889 Other general symptoms and signs: Secondary | ICD-10-CM | POA: Diagnosis not present

## 2024-01-08 DIAGNOSIS — L659 Nonscarring hair loss, unspecified: Secondary | ICD-10-CM | POA: Diagnosis not present

## 2024-01-08 DIAGNOSIS — N1831 Chronic kidney disease, stage 3a: Secondary | ICD-10-CM | POA: Diagnosis not present

## 2024-02-14 DIAGNOSIS — E559 Vitamin D deficiency, unspecified: Secondary | ICD-10-CM | POA: Diagnosis not present

## 2024-02-14 DIAGNOSIS — E1122 Type 2 diabetes mellitus with diabetic chronic kidney disease: Secondary | ICD-10-CM | POA: Diagnosis not present

## 2024-02-14 DIAGNOSIS — N1831 Chronic kidney disease, stage 3a: Secondary | ICD-10-CM | POA: Diagnosis not present

## 2024-02-14 DIAGNOSIS — Z87442 Personal history of urinary calculi: Secondary | ICD-10-CM | POA: Diagnosis not present

## 2024-02-14 DIAGNOSIS — I129 Hypertensive chronic kidney disease with stage 1 through stage 4 chronic kidney disease, or unspecified chronic kidney disease: Secondary | ICD-10-CM | POA: Diagnosis not present

## 2024-02-15 ENCOUNTER — Other Ambulatory Visit: Payer: Self-pay | Admitting: Nephrology

## 2024-02-15 DIAGNOSIS — N1831 Chronic kidney disease, stage 3a: Secondary | ICD-10-CM

## 2024-02-16 ENCOUNTER — Ambulatory Visit
Admission: RE | Admit: 2024-02-16 | Discharge: 2024-02-16 | Disposition: A | Discharge status: Home / Self Care | Source: Ambulatory Visit | Attending: Nephrology | Admitting: Nephrology

## 2024-02-16 DIAGNOSIS — N1831 Chronic kidney disease, stage 3a: Secondary | ICD-10-CM

## 2024-02-19 DIAGNOSIS — N1831 Chronic kidney disease, stage 3a: Secondary | ICD-10-CM | POA: Diagnosis not present

## 2024-02-19 DIAGNOSIS — F419 Anxiety disorder, unspecified: Secondary | ICD-10-CM | POA: Diagnosis not present

## 2024-02-19 DIAGNOSIS — F32A Depression, unspecified: Secondary | ICD-10-CM | POA: Diagnosis not present

## 2024-02-19 DIAGNOSIS — E1122 Type 2 diabetes mellitus with diabetic chronic kidney disease: Secondary | ICD-10-CM | POA: Diagnosis not present

## 2024-02-19 DIAGNOSIS — E1140 Type 2 diabetes mellitus with diabetic neuropathy, unspecified: Secondary | ICD-10-CM | POA: Diagnosis not present

## 2024-02-19 DIAGNOSIS — E539 Vitamin B deficiency, unspecified: Secondary | ICD-10-CM | POA: Diagnosis not present

## 2024-02-19 DIAGNOSIS — G4700 Insomnia, unspecified: Secondary | ICD-10-CM | POA: Diagnosis not present

## 2024-04-08 DIAGNOSIS — F32A Depression, unspecified: Secondary | ICD-10-CM | POA: Diagnosis not present

## 2024-04-08 DIAGNOSIS — N1831 Chronic kidney disease, stage 3a: Secondary | ICD-10-CM | POA: Diagnosis not present

## 2024-04-08 DIAGNOSIS — E1122 Type 2 diabetes mellitus with diabetic chronic kidney disease: Secondary | ICD-10-CM | POA: Diagnosis not present

## 2024-04-08 DIAGNOSIS — Z6822 Body mass index (BMI) 22.0-22.9, adult: Secondary | ICD-10-CM | POA: Diagnosis not present

## 2024-04-08 DIAGNOSIS — F419 Anxiety disorder, unspecified: Secondary | ICD-10-CM | POA: Diagnosis not present

## 2024-04-08 DIAGNOSIS — E1140 Type 2 diabetes mellitus with diabetic neuropathy, unspecified: Secondary | ICD-10-CM | POA: Diagnosis not present

## 2024-04-08 DIAGNOSIS — Z9181 History of falling: Secondary | ICD-10-CM | POA: Diagnosis not present

## 2024-04-08 DIAGNOSIS — E785 Hyperlipidemia, unspecified: Secondary | ICD-10-CM | POA: Diagnosis not present

## 2024-05-13 ENCOUNTER — Ambulatory Visit (AMBULATORY_SURGERY_CENTER)

## 2024-05-13 ENCOUNTER — Encounter: Payer: Self-pay | Admitting: Gastroenterology

## 2024-05-13 VITALS — Ht 64.0 in | Wt 138.4 lb

## 2024-05-13 DIAGNOSIS — Z860100 Personal history of colon polyps, unspecified: Secondary | ICD-10-CM

## 2024-05-13 MED ORDER — NA SULFATE-K SULFATE-MG SULF 17.5-3.13-1.6 GM/177ML PO SOLN: 1.0000 | Freq: Once | ORAL | 0 refills | Status: AC

## 2024-05-13 NOTE — Progress Notes (Signed)
 No egg or soy allergy known to patient  PONV - known to pt with past sedation with any surgeries or procedures Patient denies ever being told they had issues or difficulty with intubation  No FH of Malignant Hyperthermia Pt is not on diet pills Pt is not on  home 02  Pt is not on blood thinners  Constipation: yes  No A fib or A flutter Have any cardiac testing pending-- no  LOA: independent  Prep: suprep   Patient's chart reviewed by Norleen Schillings CNRA prior to previsit and patient appropriate for the [facility].  Previsit completed and red dot placed by patient's name on their procedure day (on provider's schedule).     PV completed with patient. Prep instructions sent via mychart and home address.

## 2024-06-04 ENCOUNTER — Telehealth: Payer: Self-pay | Admitting: Gastroenterology

## 2024-06-04 ENCOUNTER — Encounter: Payer: Self-pay | Admitting: Gastroenterology

## 2024-06-04 ENCOUNTER — Ambulatory Visit (AMBULATORY_SURGERY_CENTER): Admitting: Gastroenterology

## 2024-06-04 VITALS — BP 134/76 | HR 76 | Temp 98.1°F | Resp 13 | Ht 64.0 in | Wt 134.0 lb

## 2024-06-04 DIAGNOSIS — D124 Benign neoplasm of descending colon: Secondary | ICD-10-CM

## 2024-06-04 DIAGNOSIS — F419 Anxiety disorder, unspecified: Secondary | ICD-10-CM | POA: Diagnosis not present

## 2024-06-04 DIAGNOSIS — D123 Benign neoplasm of transverse colon: Secondary | ICD-10-CM | POA: Diagnosis not present

## 2024-06-04 DIAGNOSIS — Z1211 Encounter for screening for malignant neoplasm of colon: Secondary | ICD-10-CM

## 2024-06-04 DIAGNOSIS — F329 Major depressive disorder, single episode, unspecified: Secondary | ICD-10-CM | POA: Diagnosis not present

## 2024-06-04 DIAGNOSIS — Z860101 Personal history of adenomatous and serrated colon polyps: Secondary | ICD-10-CM

## 2024-06-04 DIAGNOSIS — K5730 Diverticulosis of large intestine without perforation or abscess without bleeding: Secondary | ICD-10-CM

## 2024-06-04 DIAGNOSIS — Z860100 Personal history of colon polyps, unspecified: Secondary | ICD-10-CM

## 2024-06-04 DIAGNOSIS — E119 Type 2 diabetes mellitus without complications: Secondary | ICD-10-CM | POA: Diagnosis not present

## 2024-06-04 MED ORDER — SODIUM CHLORIDE 0.9 % IV SOLN: 500.0000 mL | INTRAVENOUS | Status: DC

## 2024-06-04 NOTE — Patient Instructions (Addendum)
-  Handout on polyps and diverticulosis provided -Await pathology results  YOU HAD AN ENDOSCOPIC PROCEDURE TODAY AT THE [facility]:   Refer to the procedure report that was given to you for any specific questions about what was found during the examination.  If the procedure report does not answer your questions, please call your gastroenterologist to clarify.  If you requested that your care partner not be given the details of your procedure findings, then the procedure report has been included in a sealed envelope for you to review at your convenience later.  YOU SHOULD EXPECT: Some feelings of bloating in the abdomen. Passage of more gas than usual.  Walking can help get rid of the air that was put into your GI tract during the procedure and reduce the bloating. If you had a lower endoscopy (such as a colonoscopy or flexible sigmoidoscopy) you may notice spotting of blood in your stool or on the toilet paper. If you underwent a bowel prep for your procedure, you may not have a normal bowel movement for a few days.  Please Note:  You might notice some irritation and congestion in your nose or some drainage.  This is from the oxygen used during your procedure.  There is no need for concern and it should clear up in a day or so.  SYMPTOMS TO REPORT IMMEDIATELY:  Following lower endoscopy (colonoscopy or flexible sigmoidoscopy):  Excessive amounts of blood in the stool  Significant tenderness or worsening of abdominal pains  Swelling of the abdomen that is new, acute  Fever of 100F or higher  For urgent or emergent issues, a gastroenterologist can be reached at any hour by calling [phone]. Do not use MyChart messaging for urgent concerns.    DIET:  We do recommend a small meal at first, but then you may proceed to your regular diet.  Drink plenty of fluids but you should avoid alcoholic beverages for 24 hours.  ACTIVITY:  You should plan to take it easy for the rest of today  and you should NOT DRIVE or use heavy machinery until tomorrow (because of the sedation medicines used during the test).    FOLLOW UP: Our staff will call the number listed on your records the next business day following your procedure.  We will call around 7:15- 8:00 am to check on you and address any questions or concerns that you may have regarding the information given to you following your procedure. If we do not reach you, we will leave a message.     If any biopsies were taken you will be contacted by phone or by letter within the next 1-3 weeks.  Please call us  at [phone] if you have not heard about the biopsies in 3 weeks.    SIGNATURES/CONFIDENTIALITY: You and/or your care partner have signed paperwork which will be entered into your electronic medical record.  These signatures attest to the fact that that the information above on your After Visit Summary has been reviewed and is understood.  Full responsibility of the confidentiality of this discharge information lies with you and/or your care-partner.

## 2024-06-04 NOTE — Progress Notes (Signed)
 Report to PACU, RN, vss, BBS= Clear.

## 2024-06-04 NOTE — Telephone Encounter (Signed)
 Patient called a few minutes ago. She is supposed to arrive at 7 AM for a colonoscopy but she did not get up for her alarm and is late to start second half of her prep. She completed the first half without a problem and moved her bowels several times. Told her to start second half of prep now and finish as soon as she can, and when she thinks it has slowed enough where she can drive she will come to the [facility]. She lives about 45 minutes away. Suspect she may be 1-2 hours late on arrival from what she tells me but hopeful she can still get her procedure done.   Amanda Anthony / [facility] staff - FYI - I think this patient will be here late but really wants to get her procedure done. Order of cases may need to be adjusted to accommodate her if this is possible. [facility] staff can you please contact her when you see this to see where she is at in the process. Thanks

## 2024-06-04 NOTE — Progress Notes (Signed)
 Called to room to assist during endoscopic procedure.  Patient ID and intended procedure confirmed with present staff. Received instructions for my participation in the procedure from the performing physician.

## 2024-06-04 NOTE — Progress Notes (Signed)
VS by SM  Pt's states no medical or surgical changes since previsit or office visit.  

## 2024-06-04 NOTE — Op Note (Signed)
 [facility] Patient Name: Amanda Anthony Procedure Date: 06/04/2024 8:01 AM MRN: [id number] Endoscopist: Glendia E. Stacia , MD, [id number] Age: 68 Referring MD:  Date of Birth: 1956-06-26 Gender: Female Account #: [id number] Procedure:                Colonoscopy Indications:              Surveillance: Personal history of adenomatous                            polyps on last colonoscopy 3 years ago, High risk                            colon cancer surveillance: Personal history of                            multiple (3 or more) adenomas Medicines:                Monitored Anesthesia Care Procedure:                Pre-Anesthesia Assessment:                           - Prior to the procedure, a History and Physical                            was performed, and patient medications and                            allergies were reviewed. The patient's tolerance of                            previous anesthesia was also reviewed. The risks                            and benefits of the procedure and the sedation                            options and risks were discussed with the patient.                            All questions were answered, and informed consent                            was obtained. Prior Anticoagulants: The patient has                            taken no anticoagulant or antiplatelet agents. ASA                            Grade Assessment: II - A patient with mild systemic                            disease. After reviewing the risks and benefits,  the patient was deemed in satisfactory condition to                            undergo the procedure.                           After obtaining informed consent, the colonoscope                            was passed under direct vision. Throughout the                            procedure, the patient's blood pressure, pulse, and                            oxygen saturations were  monitored continuously. The                            CF [id number] #[id number] was introduced through the anus                            and advanced to the the cecum, identified by                            appendiceal orifice and ileocecal valve. The                            colonoscopy was performed without difficulty. The                            patient tolerated the procedure well. The quality                            of the bowel preparation was good. The ileocecal                            valve, appendiceal orifice, and rectum were                            photographed. The bowel preparation used was SUPREP                            via split dose instruction. Scope In: 8:05:24 AM Scope Out: 8:23:09 AM Scope Withdrawal Time: 0 hours 11 minutes 29 seconds  Total Procedure Duration: 0 hours 17 minutes 45 seconds  Findings:                 The perianal and digital rectal examinations were                            normal. Pertinent negatives include normal                            sphincter tone and no palpable rectal lesions.  A 3 mm polyp was found in the transverse colon. The                            polyp was sessile. The polyp was removed with a                            cold snare. Resection and retrieval were complete.                            Estimated blood loss was minimal.                           A 5 mm polyp was found in the descending colon. The                            polyp was sessile. The polyp was removed with a                            cold snare. Resection and retrieval were complete.                            Estimated blood loss was minimal.                           Multiple medium-mouthed and small-mouthed                            diverticula were found in the sigmoid colon.                           The exam was otherwise normal throughout the                            examined colon.                            The retroflexed view of the distal rectum and anal                            verge was normal and showed no anal or rectal                            abnormalities. Complications:            No immediate complications. Estimated Blood Loss:     Estimated blood loss was minimal. Impression:               - One 3 mm polyp in the transverse colon, removed                            with a cold snare. Resected and retrieved.                           - One 5 mm polyp in the descending colon, removed  with a cold snare. Resected and retrieved.                           - Mild diverticulosis in the sigmoid colon.                           - The distal rectum and anal verge are normal on                            retroflexion view. Recommendation:           - Patient has a contact number available for                            emergencies. The signs and symptoms of potential                            delayed complications were discussed with the                            patient. Return to normal activities tomorrow.                            Written discharge instructions were provided to the                            patient.                           - Resume previous diet.                           - Continue present medications.                           - Await pathology results.                           - Repeat colonoscopy in 5 years for surveillance. Josselin Gaulin E. Stacia, MD 06/04/2024 8:31:31 AM This report has been signed electronically.

## 2024-06-04 NOTE — Progress Notes (Signed)
 [facility] History and Physical   Primary Care Physician:  Kuzel, [place], NP   Reason for Procedure:   Colon cancer screening/history of polyps  Plan:    Surveillance colonoscopy     HPI: Amanda Anthony is a 68 y.o. female undergoing surveillance colonoscopy.  Her last colonoscopy was in May 2022 and 3 small tubular adenomas were removed from the transverse colon.  Her maternal aunt and maternal grandmother had colon cancer.     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   Diabetes mellitus    DIABETES MELLITUS, GESTATIONAL, HX OF 11/13/2007   Qualifier: Diagnosis of  By: Henrietta LPN, Regina    Overview:  Overview:  Qualifier: Diagnosis of  By: Henrietta LPN, Regina    GERD (gastroesophageal reflux disease)    HEMATOCHEZIA 11/13/2007   Qualifier: Diagnosis of  By: Henrietta LPN, Regina     Hematochezia 11/13/2007   Overview:  Overview:  Qualifier: Diagnosis of  By: Henrietta LPN, Regina    Hemorrhoids, internal 11/13/2007   history of with pregnancy Qualifier: Diagnosis of  By: Henrietta LPN, Regina    Overview:  Overview:  Qualifier: Diagnosis of  By: Henrietta LPN, Regina    History of kidney stones    Hx of chest pain 11/15/2017   Hypercholesteremia 04/17/2017   Hypertension 11/13/2007   pt not tretaed for HTN , patient unawareQualifier: Diagnosis of  By: Henrietta LPN, Regina    Overview:  Overview:  Qualifier: Diagnosis of  By: Laws LPN, Regina -    Insomnia    Neuromuscular disorder (HCC)    neuropathy    Neuropathy 05/17/2017   Osteopenia    PONV (postoperative nausea and vomiting)    Rash of groin 03/14/2017   Residual hemorrhoidal skin tags 11/13/2007   Qualifier: Diagnosis of  By: Henrietta LPN, Regina    Overview:  Overview:   Qualifier: Diagnosis of  By: Laws LPN, Regina    Tubular adenoma of colon 04/2015   UTI (urinary tract infection)    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   Vitamin B 12 deficiency 04/18/2017   Vitamin D deficiency     Past Surgical History:  Procedure Laterality Date   APPENDECTOMY     COLONOSCOPY  2011   hyperplastic polyps   COLONOSCOPY  01/22/2021   MS   CYSTOSCOPY WITH RETROGRADE PYELOGRAM, URETEROSCOPY AND STENT PLACEMENT Left 06/11/2020   Procedure: CYSTOSCOPY WITH RETROGRADE PYELOGRAM, URETEROSCOPY AND STENT PLACEMENT;  Surgeon: Cam Morene ORN, MD;  Location: [facility];  Service: Urology;  Laterality: Left;  75 MINS   CYSTOSCOPY/URETEROSCOPY/HOLMIUM LASER/STENT PLACEMENT Left 03/27/2019   Procedure: CYSTOSCOPY LEFT URETEROSCOPY/HOLMIUM LASER/STENT EXCHANGE;  Surgeon: Cam Morene ORN, MD;  Location: [facility];  Service: Urology;  Laterality: Left;   ENDOMETRIAL CRYOABLATION     laser to removed the endometriosis   HOLMIUM LASER APPLICATION Left 06/11/2020   Procedure: HOLMIUM LASER APPLICATION;  Surgeon: Cam Morene ORN, MD;  Location: [facility];  Service: Urology;  Laterality: Left;   KIDNEY STONE SURGERY  03/18/2019   POLYPECTOMY     TONSILLECTOMY AND ADENOIDECTOMY     UPPER GASTROINTESTINAL ENDOSCOPY     UPPER GI ENDOSCOPY  Prior to Admission medications   Medication Sig Start Date End Date Taking? Authorizing Provider  Accu-Chek Softclix Lancets lancets USE 1 to check blood glucose daily   Yes [provider]  B-D 3CC LUER-LOK SYR 25GX1 25G X 1 3 ML MISC SMARTSIG:injection As Directed 05/17/24  Yes [provider]  Cholecalciferol (VITAMIN D-3) 125 MCG (5000 UT) TABS Take 5,000 Units by mouth at bedtime.   Yes [provider]  cyanocobalamin  (VITAMIN B12) 1000 MCG/ML injection Inject 1,000 mcg into the muscle every 30 (thirty) days. 04/14/24  Yes [provider]  escitalopram (LEXAPRO) 20 MG tablet Take 1 tablet by mouth daily.   Yes  [provider]  famotidine (PEPCID) 40 MG tablet Take 40 mg by mouth daily. Patient taking differently: Take 40 mg by mouth daily as needed. 12/03/20  Yes [provider]  gabapentin (NEURONTIN) 600 MG tablet Take 600 mg by mouth at bedtime. 11/20/23  Yes [provider]  solifenacin (VESICARE) 10 MG tablet Take 10 mg by mouth daily.   Yes [provider]  traZODone (DESYREL) 100 MG tablet Take 100 mg by mouth at bedtime as needed for sleep.    Yes [provider]  lisinopril (ZESTRIL) 2.5 MG tablet Take 2.5 mg by mouth daily. Patient not taking: Reported on 06/04/2024 10/12/21   [provider]  pantoprazole  (PROTONIX ) 40 MG tablet Take 1 tablet (40 mg total) by mouth 2 (two) times daily. Patient taking differently: Take 40 mg by mouth at bedtime as needed (heartburn). 11/10/15   Aneita Gwendlyn DASEN, MD  pravastatin (PRAVACHOL) 40 MG tablet Take 40 mg by mouth at bedtime.  Patient not taking: Reported on 06/04/2024 10/09/17   [provider]  tirzepatide CLOYDE) 10 MG/0.5ML Pen Inject 10 mg into the skin once a week.    [provider]  tirzepatide CLOYDE) 10 MG/0.5ML Pen Inject 10 mg into the skin once a week.    [provider]    Current Outpatient Medications  Medication Sig Dispense Refill   Accu-Chek Softclix Lancets lancets USE 1 to check blood glucose daily     B-D 3CC LUER-LOK SYR 25GX1 25G X 1 3 ML MISC SMARTSIG:injection As Directed     Cholecalciferol (VITAMIN D-3) 125 MCG (5000 UT) TABS Take 5,000 Units by mouth at bedtime.     cyanocobalamin  (VITAMIN B12) 1000 MCG/ML injection Inject 1,000 mcg into the muscle every 30 (thirty) days.     escitalopram (LEXAPRO) 20 MG tablet Take 1 tablet by mouth daily.     famotidine (PEPCID) 40 MG tablet Take 40 mg by mouth daily. (Patient taking differently: Take 40 mg by mouth daily as needed.)     gabapentin (NEURONTIN) 600 MG tablet Take 600 mg by mouth at bedtime.      solifenacin (VESICARE) 10 MG tablet Take 10 mg by mouth daily.     traZODone (DESYREL) 100 MG tablet Take 100 mg by mouth at bedtime as needed for sleep.      lisinopril (ZESTRIL) 2.5 MG tablet Take 2.5 mg by mouth daily. (Patient not taking: Reported on 06/04/2024)     pantoprazole  (PROTONIX ) 40 MG tablet Take 1 tablet (40 mg total) by mouth 2 (two) times daily. (Patient taking differently: Take 40 mg by mouth at bedtime as needed (heartburn).) 60 tablet 11   pravastatin (PRAVACHOL) 40 MG tablet Take 40 mg by mouth at bedtime.  (Patient not taking: Reported on 06/04/2024)     tirzepatide (MOUNJARO) 10 MG/0.5ML  Pen Inject 10 mg into the skin once a week.     tirzepatide (MOUNJARO) 10 MG/0.5ML Pen Inject 10 mg into the skin once a week.     Current Facility-Administered Medications  Medication Dose Route Frequency Provider Last Rate Last Admin   0.9 %  sodium chloride  infusion  500 mL Intravenous Continuous Amanda Glendia BRAVO, MD        Allergies as of 06/04/2024 - Review Complete 06/04/2024  Allergen Reaction Noted   Pregabalin Shortness Of Breath 06/08/2020   Other Nausea And Vomiting 03/27/2019   Zocor [simvastatin] Other (See Comments) 11/10/2015    Family History  Problem Relation Age of Onset   Diverticulitis Father    Diabetes Father    Heart attack Father    Heart disease Mother    Heart attack Mother    Lung cancer Sister        died age 61-smoker    Colon polyps Sister    Colon cancer Maternal Aunt    Colon cancer Maternal Grandmother    Breast cancer Paternal Grandmother    Esophageal cancer Neg Hx    Rectal cancer Neg Hx    Stomach cancer Neg Hx     Social History   Socioeconomic History   Marital status: Married    Spouse name: Not on file   Number of children: 1   Years of education: Not on file   Highest education level: Not on file  Occupational History   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  Tobacco Use   Smoking status: Never   Smokeless  tobacco: Never  Vaping Use   Vaping status: Never Used  Substance and Sexual Activity   Alcohol use: Yes    Alcohol/week: 0.0 standard drinks of alcohol    Comment: RARE   Drug use: No   Sexual activity: Yes    Birth control/protection: Post-menopausal  Other Topics Concern   Not on file  Social History Narrative   Not on file   Social Drivers of Health   Financial Resource Strain: Not on file  Food Insecurity: Not on file  Transportation Needs: Not on file  Physical Activity: Not on file  Stress: Not on file  Social Connections: Not on file  Intimate Partner Violence: Not on file    Review of Systems:  All other review of systems negative except as mentioned in the HPI.  Physical Exam: Vital signs BP 134/84   Pulse 77   Temp 98.1 F (36.7 C)   Ht 5' 4 (1.626 m)   Wt 134 lb (60.8 kg)   SpO2 100%   BMI 23.00 kg/m   General:   Alert,  Well-developed, well-nourished, pleasant and cooperative in NAD Airway:  Mallampati 2 Lungs:  Clear throughout to auscultation.   Heart:  Regular rate and rhythm; no murmurs, clicks, rubs,  or gallops. Abdomen:  Soft, nontender and nondistended. Normal bowel sounds.   Neuro/Psych:  Normal mood and affect. A and O x 3   Asani Deniston E. Stacia, MD [facility]

## 2024-06-05 ENCOUNTER — Telehealth: Payer: Self-pay | Admitting: Lactation Services

## 2024-06-05 NOTE — Telephone Encounter (Signed)
  Follow up Call-     06/04/2024    7:29 AM 01/26/2022    9:57 AM  Call back number  Post procedure Call Back phone  # [phone]  Permission to leave phone message Yes Yes     Patient questions:  Do you have a fever, pain , or abdominal swelling? No. Pain Score  0 *  Have you tolerated food without any problems? Yes.    Have you been able to return to your normal activities? Yes.    Do you have any questions about your discharge instructions: Diet   No. Medications  No. Follow up visit  No.  Do you have questions or concerns about your Care? No.  Actions: * If pain score is 4 or above: No action needed, pain <4.

## 2024-06-07 LAB — SURGICAL PATHOLOGY

## 2024-06-08 ENCOUNTER — Ambulatory Visit: Payer: Self-pay | Admitting: Gastroenterology

## 2024-06-08 NOTE — Progress Notes (Signed)
 Ms. Romano,  The two polyps which I removed during your recent procedure were proven to be completely benign but are considered pre-cancerous polyps that MAY have grown into cancer if they had not been removed.  Studies shows that at least 20% of women over age 68 and 30% of men over age 43 have pre-cancerous polyps.  Based on your history of multiple polyps on previous colonoscopies, I recommend that you have a repeat colonoscopy in 5 years.   If you develop any new rectal bleeding, abdominal pain or significant bowel habit changes, please contact me before then.

## 2024-06-10 ENCOUNTER — Other Ambulatory Visit: Payer: HMO

## 2024-06-11 DIAGNOSIS — M65312 Trigger thumb, left thumb: Secondary | ICD-10-CM | POA: Diagnosis not present

## 2024-06-12 ENCOUNTER — Other Ambulatory Visit (HOSPITAL_BASED_OUTPATIENT_CLINIC_OR_DEPARTMENT_OTHER): Admitting: Radiology

## 2024-06-14 ENCOUNTER — Ambulatory Visit (HOSPITAL_BASED_OUTPATIENT_CLINIC_OR_DEPARTMENT_OTHER)
Admission: RE | Admit: 2024-06-14 | Discharge: 2024-06-14 | Disposition: A | Discharge status: Home / Self Care | Source: Ambulatory Visit | Attending: Obstetrics | Admitting: Obstetrics

## 2024-06-14 DIAGNOSIS — Z780 Asymptomatic menopausal state: Secondary | ICD-10-CM | POA: Diagnosis not present

## 2024-06-14 DIAGNOSIS — M85852 Other specified disorders of bone density and structure, left thigh: Secondary | ICD-10-CM | POA: Diagnosis not present

## 2024-06-14 DIAGNOSIS — M85851 Other specified disorders of bone density and structure, right thigh: Secondary | ICD-10-CM | POA: Diagnosis not present

## 2024-06-14 DIAGNOSIS — M810 Age-related osteoporosis without current pathological fracture: Secondary | ICD-10-CM | POA: Diagnosis not present

## 2024-07-15 DIAGNOSIS — M65312 Trigger thumb, left thumb: Secondary | ICD-10-CM | POA: Diagnosis not present

## 2024-08-08 DIAGNOSIS — M65312 Trigger thumb, left thumb: Secondary | ICD-10-CM | POA: Diagnosis not present

## 2024-08-08 DIAGNOSIS — Z79899 Other long term (current) drug therapy: Secondary | ICD-10-CM | POA: Diagnosis not present

## 2024-08-08 DIAGNOSIS — I10 Essential (primary) hypertension: Secondary | ICD-10-CM | POA: Diagnosis not present

## 2024-08-08 DIAGNOSIS — E1169 Type 2 diabetes mellitus with other specified complication: Secondary | ICD-10-CM | POA: Diagnosis not present

## 2024-08-08 DIAGNOSIS — E538 Deficiency of other specified B group vitamins: Secondary | ICD-10-CM | POA: Diagnosis not present

## 2024-08-08 DIAGNOSIS — K219 Gastro-esophageal reflux disease without esophagitis: Secondary | ICD-10-CM | POA: Diagnosis not present

## 2024-08-08 DIAGNOSIS — E7800 Pure hypercholesterolemia, unspecified: Secondary | ICD-10-CM | POA: Diagnosis not present

## 2024-08-08 DIAGNOSIS — Z7984 Long term (current) use of oral hypoglycemic drugs: Secondary | ICD-10-CM | POA: Diagnosis not present

## 2024-08-12 DIAGNOSIS — I129 Hypertensive chronic kidney disease with stage 1 through stage 4 chronic kidney disease, or unspecified chronic kidney disease: Secondary | ICD-10-CM | POA: Diagnosis not present

## 2024-08-12 DIAGNOSIS — Z87442 Personal history of urinary calculi: Secondary | ICD-10-CM | POA: Diagnosis not present

## 2024-08-12 DIAGNOSIS — E559 Vitamin D deficiency, unspecified: Secondary | ICD-10-CM | POA: Diagnosis not present

## 2024-08-12 DIAGNOSIS — E1122 Type 2 diabetes mellitus with diabetic chronic kidney disease: Secondary | ICD-10-CM | POA: Diagnosis not present

## 2024-08-12 DIAGNOSIS — N1831 Chronic kidney disease, stage 3a: Secondary | ICD-10-CM | POA: Diagnosis not present

## 2024-09-11 DIAGNOSIS — N182 Chronic kidney disease, stage 2 (mild): Secondary | ICD-10-CM | POA: Insufficient documentation

## 2024-09-11 DIAGNOSIS — R413 Other amnesia: Secondary | ICD-10-CM | POA: Insufficient documentation

## 2024-09-11 DIAGNOSIS — Z8679 Personal history of other diseases of the circulatory system: Secondary | ICD-10-CM | POA: Insufficient documentation

## 2024-09-11 DIAGNOSIS — R0600 Dyspnea, unspecified: Secondary | ICD-10-CM | POA: Insufficient documentation

## 2024-09-11 DIAGNOSIS — R7989 Other specified abnormal findings of blood chemistry: Secondary | ICD-10-CM | POA: Insufficient documentation

## 2024-10-02 ENCOUNTER — Encounter: Payer: Self-pay | Admitting: Cardiology

## 2024-10-02 ENCOUNTER — Ambulatory Visit: Attending: Cardiology | Admitting: Cardiology

## 2024-10-02 VITALS — BP 118/78 | HR 76 | Ht 64.0 in | Wt 138.0 lb

## 2024-10-02 DIAGNOSIS — Z8249 Family history of ischemic heart disease and other diseases of the circulatory system: Secondary | ICD-10-CM | POA: Diagnosis not present

## 2024-10-02 DIAGNOSIS — R002 Palpitations: Secondary | ICD-10-CM

## 2024-10-02 DIAGNOSIS — N200 Calculus of kidney: Secondary | ICD-10-CM | POA: Insufficient documentation

## 2024-10-02 DIAGNOSIS — E7800 Pure hypercholesterolemia, unspecified: Secondary | ICD-10-CM

## 2024-10-02 DIAGNOSIS — N19 Unspecified kidney failure: Secondary | ICD-10-CM | POA: Insufficient documentation

## 2024-10-02 DIAGNOSIS — E119 Type 2 diabetes mellitus without complications: Secondary | ICD-10-CM

## 2024-10-02 NOTE — Patient Instructions (Signed)
 Medication Instructions:  Your physician recommends that you continue on your current medications as directed. Please refer to the Current Medication list given to you today.  *If you need a refill on your cardiac medications before your next appointment, please call your pharmacy*  Lab Work: None If you have labs (blood work) drawn today and your tests are completely normal, you will receive your results only by: MyChart Message (if you have MyChart) OR A paper copy in the mail If you have any lab test that is abnormal or we need to change your treatment, we will call you to review the results.  Testing/Procedures: We will order CT coronary calcium score. It will cost $99.00 and is due at time of scan.  Please call to schedule.    [facility] at [facility] [address] [place], [place] [address] [phone]  [facility] [address] [place], [place] [address] [phone]     Follow-Up: At [facility], you and your health needs are our priority.  As part of our continuing mission to provide you with exceptional heart care, our providers are all part of one team.  This team includes your primary Cardiologist (physician) and Advanced Practice Providers or APPs (Physician Assistants and Nurse Practitioners) who all work together to provide you with the care you need, when you need it.  Your next appointment:   3 month(s)  Provider:   Redell Leiter, MD    We recommend signing up for the patient portal called MyChart.  Sign up information is provided on this After Visit Summary.  MyChart is used to connect with patients for Virtual Visits (Telemedicine).  Patients are able to view lab/test results, encounter notes, upcoming appointments, etc.  Non-urgent messages can be sent to your provider as well.   To learn more about what you can do with MyChart, go to [email or web address].   Other Instructions None

## 2024-10-02 NOTE — Progress Notes (Signed)
 " Cardiology Office Note:    Date:  10/02/2024   ID:  Amanda Anthony, DOB 10/18/1955, MRN [id number]  PCP:  Elena, Chad, NP  Cardiologist:  Redell Leiter, MD    Referring MD: Bouknight, Chad, NP    ASSESSMENT:    1. Palpitations   2. Family history of premature CAD   3. Hypercholesteremia   4. Type 2 diabetes mellitus without complication, without long-term current use of insulin (HCC)    PLAN:    In order of problems listed above:  After thorough discussion with the patient it does not seem as if she ever had a complaint of palpitation no documented arrhythmia despite monitoring extensive cardiac testing and we both decided that further evaluation with event monitors is unnecessary And increased cardiovascular risk beyond her diabetes hypertension hyperlipidemia with her family history is been greater than 5 years and we will recheck a coronary calcium score if significantly elevated we would need to intensify lipid-lowering treatment her last residual LDL 120 and likely repeat the coronary CTA She has done a great job with a GLP-1 agonist continue tirzepatide very effective for both diabetes obesity and cardiovascular outcomes Despite the calcium score of 0 in her case I would continue lipid-lowering treatment She seems quite reassured   Next appointment: I will plan to see her in 3 months or sooner depending on the score   Medication Adjustments/Labs and Tests Ordered: Current medicines are reviewed at length with the patient today.  Concerns regarding medicines are outlined above.  Orders Placed This Encounter  Procedures   CT CARDIAC SCORING   EKG 12-Lead   No orders of the defined types were placed in this encounter.    History of Present Illness:    Patient is a 69 year old woman referred for evaluation of palpitation by Chad Renda nurse practitioner.  She has a history of hyperlipidemia type 2 diabetes and CKD  I had seen her 2019 hypertension per lipidemia family  history of premature CAD her mother died at age 63 sounds like she had acute myocardial infarction and back then she had a CTA done with a calcium score of 0 and no obstructive CAD.  I reviewed her history with her she really presented with intractable nausea vomiting diarrhea after eating seafood she thinks she had food poisoning it lasted about 48 hours and resolved spontaneously  She mentioned the emergency room about her mother's premature CAD and the whole evaluation took a turn and was directed towards cardiology Despite the charts describing palpitation she said it never recurred She fully recovered and has had no chest pain shortness of breath palpitation or syncope She has been on tirzepatide has lost greater than 80 pounds she takes a statin despite a coronary calcium score of 0 with her family history and is on low-dose aspirin . She asked me whether she needs to wear a cardiac monitor in the absence of symptoms or any documented arrhythmia I do not think she needs to and she is comfortable with this approach I did ask her to repeat a coronary calcium score typically after 5 years is valid and can allow us  to recalculate her cardiovascular risk in view of her family history and diabetes. In sum I do not think her presentation to the hospital in [place]  was cardiovascular in etiology   She was seen with her primary care physician yesterday after recent hospitalization in [place] .  Because of ongoing palpitation referred to cardiology.  It appears if lab work was performed  including TSH T3 and T4 results are unavailable.  No arrhythmia was noted during her hospitalization and cardiac testing echo perfusion study unremarkable normal.  She was seen by [facility]ology [facility], [place] [id number] the note says that she had elevated troponin chest pain angina on exertion and was planned to have a myocardial perfusion study performed.  There is an echocardiogram report left  ventricle normal size wall thickness EF 55 to 60% normal right ventricle left atrium the right atrium and no significant valvular disease or aortopathy.  The note describes her having confusion?  TGA and MRI with small punctate infarcts CTA of the chest showed no finding of pulmonary embolism LDL cholesterol was 97.  There troponin assay troponin T was 3 times upper limit of normal repeat higher at 65 4 times upper limit.  Myocardial perfusion study showed normal left ventricular perfusion function and ejection fraction appears that she had a carotid cerebrovascular duplex done but there is no report in the system in reviewing the records that I do not see at discharge summary.  Per admission note says that the patient was having nausea vomiting diarrhea palpitation generalized chest pain described as stabbing and constant nonanginal in nature cough shortness of breath chills body aches prompting emergency evaluation.  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   Diabetes mellitus    DIABETES MELLITUS, GESTATIONAL, HX OF 11/13/2007   Qualifier: Diagnosis of  By: Henrietta LPN, Regina    Overview:  Overview:  Qualifier: Diagnosis of  By: Henrietta LPN, Regina    GERD (gastroesophageal reflux disease)    HEMATOCHEZIA 11/13/2007   Qualifier: Diagnosis of  By: Henrietta LPN, Regina     Hematochezia 11/13/2007   Overview:  Overview:  Qualifier: Diagnosis of  By: Henrietta LPN, Regina    Hemorrhoids, internal 11/13/2007   history of with pregnancy Qualifier: Diagnosis of  By: Henrietta LPN, Regina    Overview:  Overview:  Qualifier: Diagnosis of  By: Henrietta LPN, Regina    History of kidney stones    Hx of chest pain  11/15/2017   Hypercholesteremia 04/17/2017   Hypertension 11/13/2007   pt not tretaed for HTN , patient unawareQualifier: Diagnosis of  By: Henrietta LPN, Regina    Overview:  Overview:  Qualifier: Diagnosis of  By: Laws LPN, Regina -    Insomnia    Neuromuscular disorder (HCC)    neuropathy    Neuropathy 05/17/2017   Osteopenia    PONV (postoperative nausea and vomiting)    Rash of groin 03/14/2017   Residual hemorrhoidal skin tags 11/13/2007   Qualifier: Diagnosis of  By: Henrietta LPN, Regina    Overview:  Overview:  Qualifier: Diagnosis of  By: Laws LPN, Regina    Tubular adenoma of colon 04/2015   UTI (urinary tract infection)    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 Rockey Jacquet [facility] on 07/09/2013. This over-read does not include interpretation of cardiac or coronary anatomy or pathology. The CT a interpretation by the cardiologist is attached.  COMPARISON:  Chest radiograph 11/08/2017  FINDINGS:  Vascular: Normal aortic caliber. No central pulmonary embolism, on this non-dedicated study.  Mediastinum/Nodes: No imaged thoracic adenopathy. Tiny hiatal hernia.  Lungs/Pleura: No pleural fluid. Subpleural right lower lobe 4 mm pulmonary nodule on image 23/13. There is a 3 mm left lower lobe pulmonary nodule on image 31/13.  Upper Abdomen: hepatic steatosis. Normal imaged portions of  the spleen.  Musculoskeletal: No acute osseous abnormality.  IMPRESSION:  1.  No acute findings in the imaged extracardiac chest. 2. Bibasilar pulmonary nodules. No follow-up needed if patient is low-risk. Non-contrast chest CT can be considered in 12 months if patient is high-risk. This recommendation follows the consensus statement: Guidelines for Management of Incidental Pulmonary Nodules Detected on CT Images: From the Fleischner Society 2017; Radiology 2017; 284:228-243. 3. Hepatic steatosis.   Electronically Signed By: Rockey Kilts M.D. On: 02/15/2018 10:23  Narrative CLINICAL DATA:  Chest pain  EXAM: Cardiac CTA  MEDICATIONS: Sub lingual nitro.4mg  x 2 and lopressor  5mg  IV  TECHNIQUE: The patient was scanned on a Siemens 192 slice scanner. Gantry rotation speed was 250 msecs. Collimation was 0.6 mm. A 100 kV prospective scan was triggered in the ascending thoracic aorta at 35-75% of the R-R interval. Average HR during the scan was 60 bpm. The 3D data set was interpreted on a dedicated work station using MPR, MIP and VRT modes. A total of 80cc of contrast was used.  FINDINGS: Non-cardiac: See separate report from [facility].  Calcium Score: 0 Agatston units  Coronary Arteries: Right dominant with no anomalies  LM: No plaque or stenosis.  LAD system:  No plaque or stenosis.  Circumflex system: No plaque or stenosis.  RCA system:  No plaque or stenosis.  IMPRESSION: 1. Coronary artery calcium score 0 Agatston units. This suggests low risk for future cardiac events.  2.  No coronary disease noted.  [employer]  Electronically Signed: By: Ezra Shuck M.D. On: 02/15/2018 09:04     ______________________________________________________________________________________________      EKG Interpretation Date/Time:  Wednesday October 02 2024 15:53:57 EST Ventricular Rate:  76 PR Interval:  178 QRS Duration:  70 QT Interval:  398 QTC  Calculation: 447 R Axis:   11  Text Interpretation: Normal sinus rhythm Abnormal R wave progression When compared with ECG of 11-Jun-2020 08:08, QRS axis Shifted right Septal infarct is now Present Confirmed by Monetta Rogue ([id number]) on 10/02/2024 4:03:17 PM   Recent Labs: No results found for requested labs within last 365 days.  Recent Lipid Panel    Component Value Date/Time   CHOL  07/29/2010 0545    200        ATP III CLASSIFICATION:  <200     mg/dL   Desirable  799-760  mg/dL   Borderline High  >=759    mg/dL   High          TRIG 833 (H) 07/29/2010 0545   HDL 47 07/29/2010 0545   CHOLHDL 4.3 07/29/2010 0545   VLDL 33 07/29/2010 0545   LDLCALC (H) 07/29/2010 0545    120  Total Cholesterol/HDL:CHD Risk Coronary Heart Disease Risk Table                     Men   Women  1/2 Average Risk   3.4   3.3  Average Risk       5.0   4.4  2 X Average Risk   9.6   7.1  3 X Average Risk  23.4   11.0        Use the calculated Patient Ratio above and the CHD Risk Table to determine the patient's CHD Risk.        ATP III CLASSIFICATION (LDL):  <100     mg/dL   Optimal  899-870  mg/dL   Near or Above                    Optimal  130-159  mg/dL   Borderline  839-810  mg/dL   High  >809     mg/dL   Very High    Physical Exam:    VS:  BP 118/78   Pulse 76   Ht 5' 4 (1.626 m)   Wt 138 lb (62.6 kg)   SpO2 98%   BMI 23.69 kg/m     Wt Readings from Last 3 Encounters:  10/02/24 138 lb (62.6 kg)  06/04/24 134 lb (60.8 kg)  05/13/24 138 lb 6.4 oz (62.8 kg)     GEN:  Well nourished, well developed in no acute distress HEENT: Normal NECK: No JVD; No carotid bruits LYMPHATICS: No lymphadenopathy CARDIAC: RRR, no murmurs, rubs, gallops RESPIRATORY:  Clear to auscultation without rales, wheezing or rhonchi  ABDOMEN: Soft, non-tender, non-distended MUSCULOSKELETAL:  No edema; No deformity  SKIN: Warm and dry NEUROLOGIC:  Alert and oriented x 3 PSYCHIATRIC:  Normal affect     Signed, Redell Leiter, MD  10/02/2024 4:35 PM    [facility]      [1]  Current Meds  Medication Sig   Accu-Chek Softclix Lancets lancets USE 1 to check blood glucose daily   ALPRAZolam (XANAX) 0.5 MG tablet Take 0.5 mg by mouth 2 (two) times daily as needed.   aspirin  EC 81 MG tablet Take 81 mg by mouth daily. Swallow whole.   B-D 3CC LUER-LOK SYR 25GX1 25G X 1 3 ML MISC SMARTSIG:injection As Directed   busPIRone (BUSPAR) 7.5 MG tablet Take 7.5 mg by mouth 2 (two) times daily.   Cholecalciferol (VITAMIN D-3) 125 MCG (5000 UT) TABS Take 5,000 Units by mouth at bedtime.   cyanocobalamin  (VITAMIN B12) 1000 MCG/ML injection Inject 1,000 mcg into the muscle every 30 (thirty) days.   escitalopram (LEXAPRO) 20 MG tablet Take 1 tablet by mouth daily.   famotidine (PEPCID) 40 MG tablet Take 40 mg by mouth daily. (Patient taking differently: Take 40 mg by mouth daily as needed.)   pantoprazole  (PROTONIX ) 40 MG tablet Take 1 tablet (40 mg total) by mouth 2 (two) times daily. (Patient taking differently: Take 40 mg by mouth at bedtime as needed (heartburn).)   solifenacin (VESICARE) 10 MG tablet Take 10 mg by mouth daily.   tirzepatide (MOUNJARO) 10 MG/0.5ML Pen Inject 10 mg into the skin once a week.   traZODone (DESYREL) 100 MG tablet Take 100 mg by mouth at bedtime as needed for sleep.    "

## 2024-10-11 ENCOUNTER — Inpatient Hospital Stay (HOSPITAL_BASED_OUTPATIENT_CLINIC_OR_DEPARTMENT_OTHER): Admission: RE | Admit: 2024-10-11 | Payer: Self-pay | Admitting: Radiology

## 2024-10-11 DIAGNOSIS — R002 Palpitations: Secondary | ICD-10-CM

## 2024-12-31 ENCOUNTER — Ambulatory Visit: Admitting: Cardiology
